# Patient Record
Sex: Male | Born: 1970 | Race: White | Hispanic: No | Marital: Married | Smoking: Former smoker
Health system: Southern US, Community
[De-identification: ages and names within clinical notes are randomized; demographics above are authoritative.]

## PROBLEM LIST (undated history)

## (undated) DIAGNOSIS — R079 Chest pain, unspecified: Secondary | ICD-10-CM

## (undated) DIAGNOSIS — B029 Zoster without complications: Secondary | ICD-10-CM

## (undated) DIAGNOSIS — M722 Plantar fascial fibromatosis: Secondary | ICD-10-CM

## (undated) DIAGNOSIS — M545 Low back pain, unspecified: Secondary | ICD-10-CM

## (undated) DIAGNOSIS — R109 Unspecified abdominal pain: Secondary | ICD-10-CM

## (undated) DIAGNOSIS — F410 Panic disorder [episodic paroxysmal anxiety] without agoraphobia: Secondary | ICD-10-CM

## (undated) DIAGNOSIS — S96811A Strain of other specified muscles and tendons at ankle and foot level, right foot, initial encounter: Secondary | ICD-10-CM

## (undated) DIAGNOSIS — T7840XA Allergy, unspecified, initial encounter: Secondary | ICD-10-CM

## (undated) DIAGNOSIS — K529 Noninfective gastroenteritis and colitis, unspecified: Secondary | ICD-10-CM

## (undated) DIAGNOSIS — R002 Palpitations: Secondary | ICD-10-CM

## (undated) DIAGNOSIS — F4310 Post-traumatic stress disorder, unspecified: Secondary | ICD-10-CM

## (undated) DIAGNOSIS — S069X9A Unspecified intracranial injury with loss of consciousness of unspecified duration, initial encounter: Secondary | ICD-10-CM

## (undated) HISTORY — DX: Low back pain, unspecified: M54.50

## (undated) HISTORY — DX: Zoster without complications: B02.9

## (undated) HISTORY — DX: Plantar fascial fibromatosis: M72.2

## (undated) HISTORY — DX: Noninfective gastroenteritis and colitis, unspecified: K52.9

## (undated) HISTORY — DX: Chest pain, unspecified: R07.9

## (undated) HISTORY — DX: Unspecified abdominal pain: R10.9

## (undated) HISTORY — DX: Allergy, unspecified, initial encounter: T78.40XA

## (undated) HISTORY — DX: Low back pain: M54.5

## (undated) HISTORY — PX: SHOULDER SURGERY: SHX246

## (undated) HISTORY — DX: Palpitations: R00.2

## (undated) HISTORY — DX: Strain of other specified muscles and tendons at ankle and foot level, right foot, initial encounter: S96.811A

## (undated) HISTORY — PX: ANKLE SURGERY: SHX546

---
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
## 2002-06-29 ENCOUNTER — Encounter: Payer: Self-pay | Admitting: Emergency Medicine

## 2002-06-29 ENCOUNTER — Emergency Department (HOSPITAL_COMMUNITY): Admission: EM | Admit: 2002-06-29 | Discharge: 2002-06-30 | Payer: Self-pay | Admitting: Emergency Medicine

## 2004-11-19 DIAGNOSIS — S069X9A Unspecified intracranial injury with loss of consciousness of unspecified duration, initial encounter: Secondary | ICD-10-CM

## 2004-11-19 DIAGNOSIS — S069XAA Unspecified intracranial injury with loss of consciousness status unknown, initial encounter: Secondary | ICD-10-CM

## 2004-11-19 HISTORY — DX: Unspecified intracranial injury with loss of consciousness status unknown, initial encounter: S06.9XAA

## 2004-11-19 HISTORY — DX: Unspecified intracranial injury with loss of consciousness of unspecified duration, initial encounter: S06.9X9A

## 2008-05-01 ENCOUNTER — Emergency Department (HOSPITAL_COMMUNITY): Admission: EM | Admit: 2008-05-01 | Discharge: 2008-05-01 | Payer: Self-pay | Admitting: Emergency Medicine

## 2008-07-13 ENCOUNTER — Emergency Department (HOSPITAL_COMMUNITY): Admission: EM | Admit: 2008-07-13 | Discharge: 2008-07-13 | Payer: Self-pay | Admitting: Emergency Medicine

## 2008-11-30 ENCOUNTER — Encounter: Admission: RE | Admit: 2008-11-30 | Discharge: 2008-11-30 | Payer: Self-pay | Admitting: Neurology

## 2008-12-01 ENCOUNTER — Encounter: Admission: RE | Admit: 2008-12-01 | Discharge: 2009-01-18 | Payer: Self-pay | Admitting: Neurology

## 2008-12-20 ENCOUNTER — Encounter: Admission: RE | Admit: 2008-12-20 | Discharge: 2008-12-20 | Payer: Self-pay | Admitting: Neurology

## 2009-01-11 ENCOUNTER — Emergency Department (HOSPITAL_COMMUNITY): Admission: EM | Admit: 2009-01-11 | Discharge: 2009-01-11 | Payer: Self-pay | Admitting: Emergency Medicine

## 2009-04-28 IMAGING — CR DG CHEST 2V
2 series · 2 of 2 positions shown · non-contrast
Comparison: Chest radiograph 05/01/2008

CLINICAL DATA: Tachycardia

CHEST - 2 VIEW

[w chest pa]
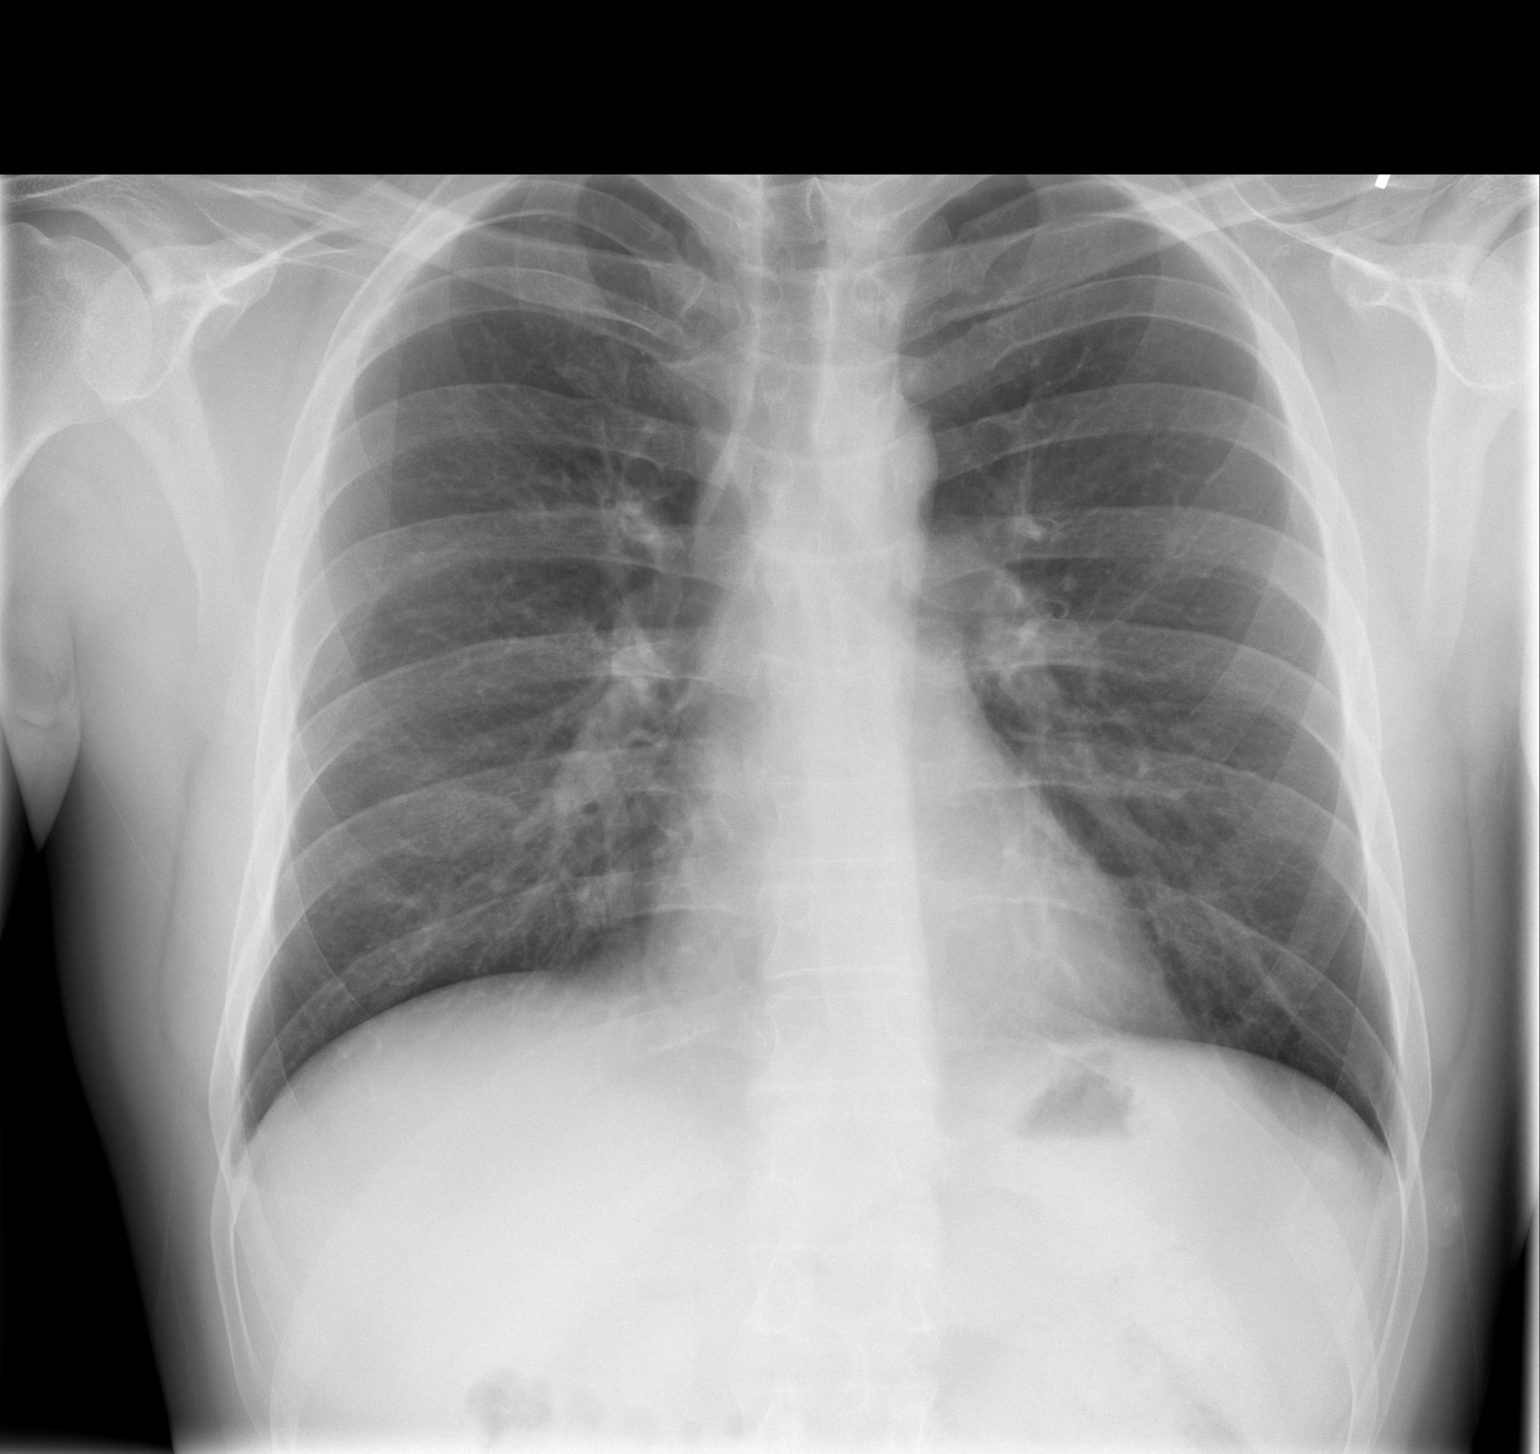

[w chest lat]
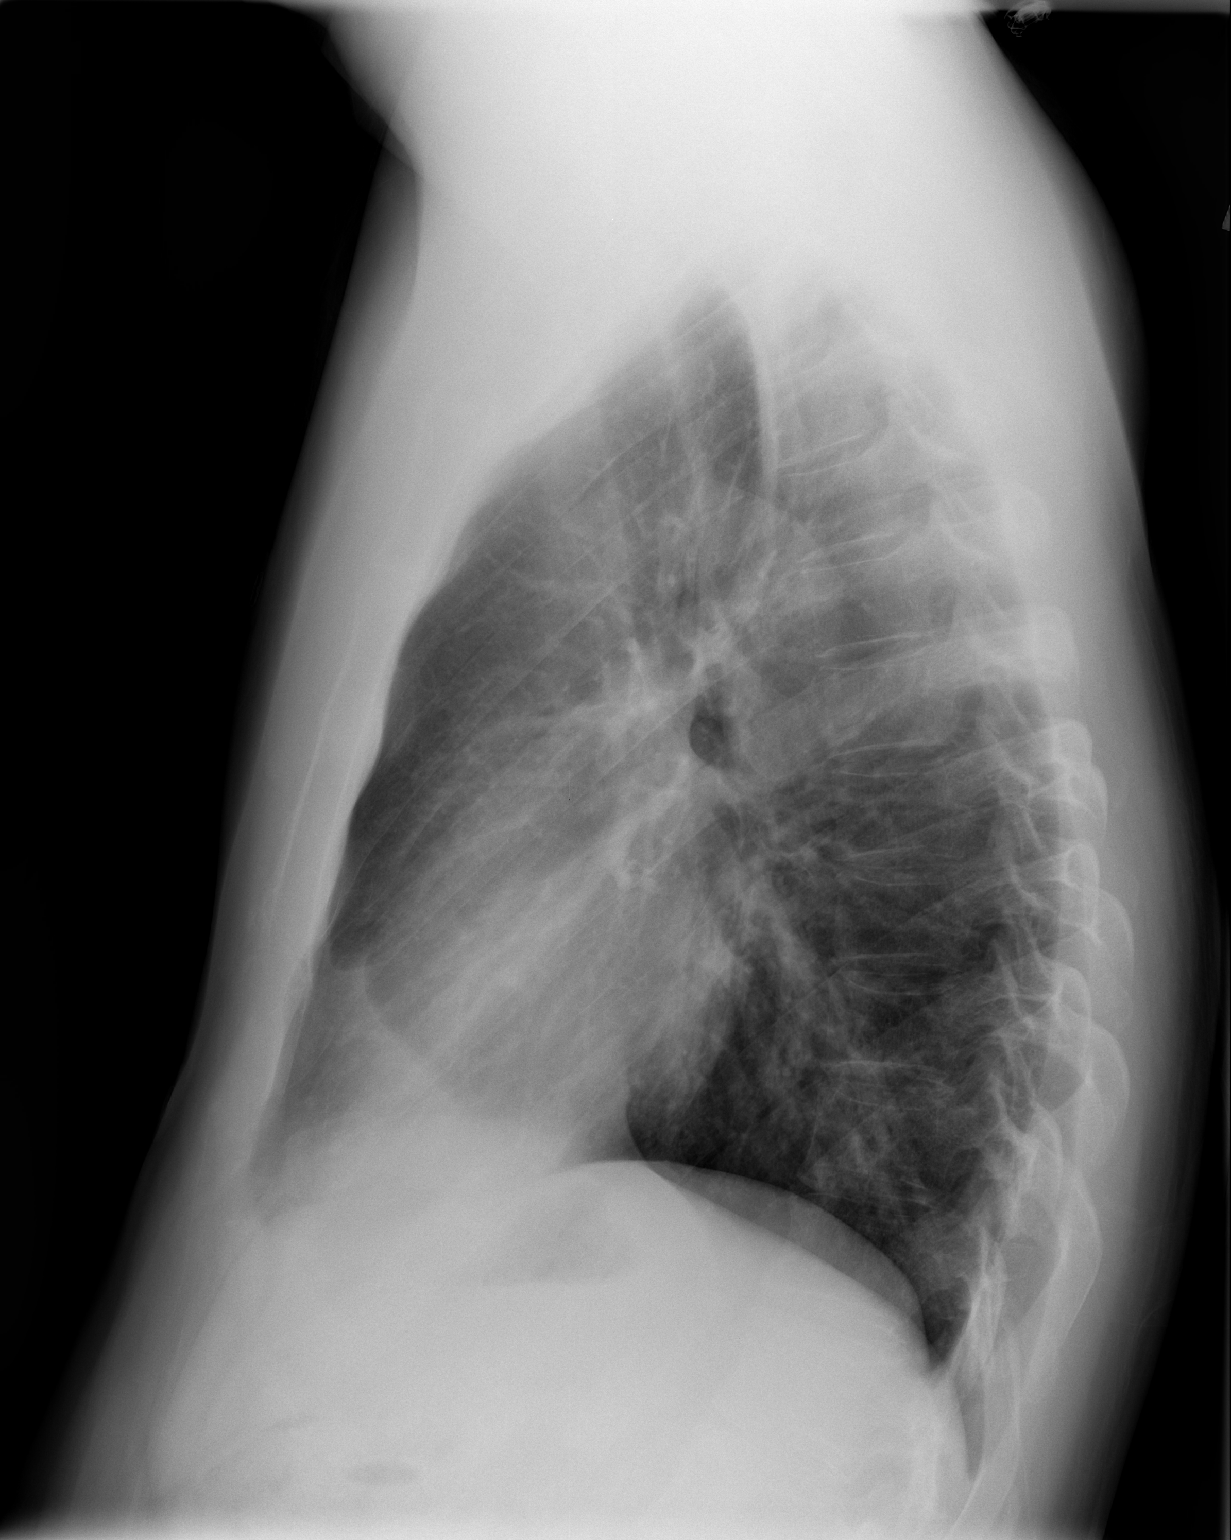

[2 of 2 positions shown; findings below may reference images not displayed]

FINDINGS: Normal mediastinum and heart silhouette.  Costophrenic
angles are clear.  No evidence effusion, infiltrate, or
pneumothorax.
IMPRESSION: No acute cardiopulmonary process.

## 2011-03-06 LAB — BASIC METABOLIC PANEL
BUN: 14 mg/dL (ref 6–23)
CO2: 29 mEq/L (ref 19–32)
Calcium: 9.7 mg/dL (ref 8.4–10.5)
Chloride: 104 mEq/L (ref 96–112)
Creatinine, Ser: 1.28 mg/dL (ref 0.4–1.5)
GFR calc Af Amer: >60 mL/min (ref >60)
GFR calc non Af Amer: >60 mL/min (ref >60)
Glucose, Bld: 91 mg/dL (ref 70–99)
Potassium: 3.7 mEq/L (ref 3.5–5.1)
Sodium: 141 mEq/L (ref 135–145)

## 2011-03-06 LAB — CBC
MCHC: 34.8 g/dL (ref 30.0–36.0)
MCV: 88.9 fL (ref 78.0–100.0)
RBC: 4.84 MIL/uL (ref 4.22–5.81)

## 2011-03-06 LAB — POCT CARDIAC MARKERS

## 2011-08-16 LAB — DIFFERENTIAL
Eosinophils Absolute: 0.1
Eosinophils Relative: 1
Lymphs Abs: 1.8
Monocytes Absolute: 0.5
Monocytes Relative: 8

## 2011-08-16 LAB — POCT I-STAT, CHEM 8
BUN: 16
Chloride: 106
Creatinine, Ser: 1.4
Potassium: 4.2
Sodium: 140

## 2011-08-16 LAB — CBC
HCT: 39.5
Hemoglobin: 13.5
MCV: 87.5
Platelets: 191
RBC: 4.51
WBC: 5.9

## 2013-08-14 ENCOUNTER — Emergency Department (HOSPITAL_COMMUNITY)
Admission: EM | Admit: 2013-08-14 | Discharge: 2013-08-14 | Disposition: A | Discharge status: Home / Self Care | Payer: 59 | Attending: Emergency Medicine | Admitting: Emergency Medicine

## 2013-08-14 ENCOUNTER — Encounter (HOSPITAL_COMMUNITY): Payer: Self-pay | Admitting: *Deleted

## 2013-08-14 ENCOUNTER — Emergency Department (HOSPITAL_COMMUNITY): Payer: 59

## 2013-08-14 DIAGNOSIS — S93409A Sprain of unspecified ligament of unspecified ankle, initial encounter: Secondary | ICD-10-CM | POA: Insufficient documentation

## 2013-08-14 DIAGNOSIS — S93401A Sprain of unspecified ligament of right ankle, initial encounter: Secondary | ICD-10-CM

## 2013-08-14 DIAGNOSIS — Y9302 Activity, running: Secondary | ICD-10-CM | POA: Insufficient documentation

## 2013-08-14 DIAGNOSIS — Y929 Unspecified place or not applicable: Secondary | ICD-10-CM | POA: Insufficient documentation

## 2013-08-14 DIAGNOSIS — X500XXA Overexertion from strenuous movement or load, initial encounter: Secondary | ICD-10-CM | POA: Insufficient documentation

## 2013-08-14 MED ORDER — IBUPROFEN 800 MG PO TABS
800.0000 mg | ORAL_TABLET | Freq: Once | ORAL | Status: AC
  Administered 2013-08-14: 800 mg via ORAL
  Filled 2013-08-14: qty 1

## 2013-08-14 MED ORDER — IBUPROFEN 800 MG PO TABS: 800.0000 mg | ORAL_TABLET | Freq: Four times a day (QID) | ORAL | Status: DC | PRN

## 2013-08-14 NOTE — ED Provider Notes (Signed)
Medical screening examination/treatment/procedure(s) were performed by non-physician practitioner and as supervising physician I was immediately available for consultation/collaboration.  Ama Mcmaster T Emarion Toral, MD 08/14/13 2316 

## 2013-08-14 NOTE — ED Notes (Signed)
Pt escorted to discharge window. Pt verbalized understanding discharge instructions. In no acute distress.  

## 2013-08-14 NOTE — ED Notes (Signed)
Pt reports he was running and felt a pop in his right ankle. Has pain from ankle up leg. Pain 9/10. Pt able to ambulate with difficulty.

## 2013-08-14 NOTE — ED Provider Notes (Signed)
CSN: [id number]     Arrival date & time 08/14/13  1511 History  This chart was scribed for non-physician practitioner Francee Piccolo, working with Toy Baker, MD by Carl Best, ED Scribe. This patient was seen in room WTR6/WTR6 and the patient's care was started at 4:21 PM.      Chief Complaint  Patient presents with  . Ankle Pain  . Leg Pain    Patient is a 42 y.o. male presenting with ankle pain and leg pain. The history is provided by the patient. No language interpreter was used.  Ankle Pain Associated symptoms: no fever   Leg Pain Associated symptoms: no fever    HPI Comments: Vernon Charles is a 42 y.o. male who presents to the Emergency Department complaining of right ankle pain that started today while he was running.  Patient states that he heard something "pop" while he was running and felt a pain shoot up his right leg.  He states that he tried to stretch out the affected area and put ice on it with no relief.  Patient states that walking aggravates the pain.  Patient denies fever and chills.  Patient denies a history of falls.  Patient denies taking Motrin for the pain.    History reviewed. No pertinent past medical history. Past Surgical History  Procedure Laterality Date  . Shoulder surgery      right shoulder- 2 surgery 1985 and 2011  . Ankle surgery      left 1989   History reviewed. No pertinent family history. History  Substance Use Topics  . Smoking status: Never Smoker   . Smokeless tobacco: Not on file  . Alcohol Use: Yes     Comment: socially    Review of Systems  Constitutional: Negative for fever and chills.  Musculoskeletal: Positive for arthralgias (right ankle).    Allergies  Review of patient's allergies indicates no known allergies.  Home Medications   Current Outpatient Rx  Name  Route  Sig  Dispense  Refill  . ibuprofen (ADVIL,MOTRIN) 800 MG tablet   Oral   Take 1 tablet (800 mg total) by mouth every 6 (six) hours as  needed for pain.   30 tablet   0     Triage Vitals: BP 130/93  Pulse 91  Temp(Src) 98.7 F (37.1 C) (Oral)  Resp 16  SpO2 97%  Physical Exam  Constitutional: He is oriented to person, place, and time. He appears well-developed and well-nourished. No distress.  HENT:  Head: Normocephalic and atraumatic.  Right Ear: External ear normal.  Left Ear: External ear normal.  Nose: Nose normal.  Eyes: Conjunctivae are normal.  Neck: Neck supple.  Pulmonary/Chest: Effort normal.  Musculoskeletal:       Right ankle: He exhibits normal range of motion, no swelling, no ecchymosis, no deformity, no laceration and normal pulse. Tenderness. Achilles tendon normal.       Left ankle: Normal.       Right lower leg: He exhibits tenderness. He exhibits no bony tenderness, no swelling, no edema, no deformity and no laceration.       Legs:      Right foot: Normal.       Left foot: Normal.       Feet:  Neurological: He is alert and oriented to person, place, and time.  Skin: Skin is warm and dry. He is not diaphoretic.  Psychiatric: He has a normal mood and affect.    ED Course  Procedures (including  critical care time)  DIAGNOSTIC STUDIES: Oxygen Saturation is 97% on room air, normal by my interpretation.    COORDINATION OF CARE: 4:23 PM- Discussed clinical suspicion of a strain to a muscle ligament and discharging the patient with an ace wrap and a prescription for Motrin.  Patient agreed to the treatment plan.     Labs Review Labs Reviewed - No data to display Imaging Review Dg Ankle Complete Right  08/14/2013   CLINICAL DATA:  Ankle pain while running.  EXAM: RIGHT ANKLE - COMPLETE 3+ VIEW  COMPARISON:  None.  FINDINGS: There is no evidence of fracture, dislocation, or joint effusion. There is no evidence of arthropathy or other focal bone abnormality. Soft tissues are unremarkable.  IMPRESSION: Negative.   Electronically Signed   By: Kennith Center M.D.   On: 08/14/2013 16:14    MDM    1. Right ankle sprain, initial encounter    Afebrile, NAD, non-toxic appearing, AAOx4. Neurovascularly intact. No sensory deficit. Imaging shows no fracture. Directed pt to ice injury, take acetaminophen or ibuprofen for pain, and to elevate and rest the injury when possible. Wrapped ankle for support and comfort. Pt declined crutches. Advised PCP followup. Resource card given. Advised patient to followup with orthopedist if symptoms not improving after one week of supportive measures. Return precautions discussed Patient agreeable to plan. Patient stable at time of discharge.   I personally performed the services described in this documentation, which was scribed in my presence. The recorded information has been reviewed and is accurate.     Jeannetta Ellis, PA-C 08/14/13 2011

## 2013-11-19 DIAGNOSIS — S96811A Strain of other specified muscles and tendons at ankle and foot level, right foot, initial encounter: Secondary | ICD-10-CM

## 2013-11-19 DIAGNOSIS — K529 Noninfective gastroenteritis and colitis, unspecified: Secondary | ICD-10-CM

## 2013-11-19 HISTORY — DX: Strain of other specified muscles and tendons at ankle and foot level, right foot, initial encounter: S96.811A

## 2013-11-19 HISTORY — DX: Noninfective gastroenteritis and colitis, unspecified: K52.9

## 2014-04-14 ENCOUNTER — Other Ambulatory Visit: Payer: Self-pay | Admitting: Internal Medicine

## 2014-04-14 DIAGNOSIS — R197 Diarrhea, unspecified: Secondary | ICD-10-CM

## 2014-04-14 DIAGNOSIS — R12 Heartburn: Secondary | ICD-10-CM

## 2014-04-22 ENCOUNTER — Ambulatory Visit
Admission: RE | Admit: 2014-04-22 | Discharge: 2014-04-22 | Disposition: A | Discharge status: Home / Self Care | Payer: 59 | Source: Ambulatory Visit | Attending: Internal Medicine | Admitting: Internal Medicine

## 2014-04-22 DIAGNOSIS — R197 Diarrhea, unspecified: Secondary | ICD-10-CM

## 2014-04-22 DIAGNOSIS — R12 Heartburn: Secondary | ICD-10-CM

## 2015-01-18 DIAGNOSIS — R109 Unspecified abdominal pain: Secondary | ICD-10-CM

## 2015-01-18 HISTORY — DX: Unspecified abdominal pain: R10.9

## 2015-04-06 ENCOUNTER — Encounter: Payer: Self-pay | Admitting: Sports Medicine

## 2015-04-06 ENCOUNTER — Ambulatory Visit: Payer: Self-pay | Admitting: Podiatry

## 2015-04-06 ENCOUNTER — Ambulatory Visit (INDEPENDENT_AMBULATORY_CARE_PROVIDER_SITE_OTHER): Payer: 59 | Admitting: Sports Medicine

## 2015-04-06 VITALS — BP 131/78 | HR 79 | Ht 72.0 in | Wt 190.0 lb

## 2015-04-06 DIAGNOSIS — M722 Plantar fascial fibromatosis: Secondary | ICD-10-CM

## 2015-04-14 ENCOUNTER — Encounter: Payer: Self-pay | Admitting: Sports Medicine

## 2015-04-14 DIAGNOSIS — M722 Plantar fascial fibromatosis: Secondary | ICD-10-CM | POA: Insufficient documentation

## 2015-04-14 NOTE — Progress Notes (Signed)
  Lovette ClicheCarter R Burrowes - 44 y.o. male MRN [id number]  Date of birth: 1971-09-25  SUBJECTIVE: CC: 1.  right heel pain, initial evaluation      HPI:   long-standing symptoms with 2 weeks of significant worsening of right plantar heel pain. Pain is worse upon first weightbearing after sleep. Also worse with prolonged sitting immobilization such as when he is in his [employer]'s car.  Has tried gentle stretching, anti-inflammatory's no significant improvement.  Currently not interfering with his ability to perform his job however is having significant pain.  Seen by his PCP referred him here for further evaluation gait.     ROS:   denies fevers, chills bilateral symptoms.    HISTORY:  Past Medical, Surgical, Social, and Family History reviewed & updated per EMR.  Pertinent Historical Findings include: Social History   Occupational History  . [employer]   Social History Main Topics  . Smoking status: Never Smoker   . Smokeless tobacco: Not on file  . Alcohol Use: Yes     Comment: socially  . Drug Use: No  . Sexual Activity: Not on file    No specialty comments available. Problem  Plantar Fasciitis of Right Foot   Otherwise relatively healthy  OBJECTIVE:  VS:   HT:6' (182.9 cm)   WT:190 lb (86.183 kg)  BMI:25.8          BP:131/78 mmHg  HR:79bpm  TEMP: ( )  RESP:   PHYSICAL EXAM: GENERAL:  adult, well-built Caucasian male. No acute distress  PSYCH: Alert and appropriately interactive.  SKIN: No open skin lesions or abnormal skin markings on areas inspected as below  VASCULAR: DP & PT pulses 2+/4. No pre-tibial edema  NEURO: Lower extremity strength is 5+/5 in all myotomes; sensation is intact to light touch in all dermatomes.  RIGHT FOOT: Overall well aligned no significant deformity. No significant hindfoot varus or valgus. Midfoot longitudinal arch is well maintained. Forefoot transverse arch is moderate with slight splay toe. Overall good posterior  tibialis recruitment. No significant Morton's callus. Significant tenderness palpation over the insertion of the plantar fascia. Ankle dorsiflexion to 90 on the right, 100 on the left.  DATA OBTAINED: No notes on file LIMITED MSK ULTRASOUND OF RIGHT FOOT: Findings:  Markedly thickened and hypoechoic insertion of the plantar fascia of the right compared to the left. Left is measuring normal approximately 0.4 cm; left is thickened approximately 0.65 cm Impression: Acute right plantar fasciitis  ASSESSMENT & PLAN: See problem based charting & AVS for additional documentation Problem List Items Addressed This Visit    Plantar fasciitis of right foot - Primary    Body Helix right ankle compression sleeve provided today. Discussed using during activity as well as for the first 2 hours following activity and PRN. Alfredson exercises with emphasis on eccentric phase Green sports insoles, discussed use of gel heel cups if needed but suspect will improve with body helix and Green sports insoles. Ice when necessary, NSAIDs for pain but discussed this is only temporizing.         FOLLOW UP:  Return in about 6 weeks (around 05/18/2015), or if symptoms worsen or fail to improve, for Consideration of repeat ultrasound to see how he is doing..Marland Kitchen

## 2015-04-14 NOTE — Assessment & Plan Note (Addendum)
Body Helix right ankle compression sleeve provided today. Discussed using during activity as well as for the first 2 hours following activity and PRN. Alfredson exercises with emphasis on eccentric phase Green sports insoles, discussed use of gel heel cups if needed but suspect will improve with body helix and Green sports insoles. Ice when necessary, NSAIDs for pain but discussed this is only temporizing.

## 2015-08-03 ENCOUNTER — Encounter: Payer: Self-pay | Admitting: Sports Medicine

## 2015-08-03 ENCOUNTER — Ambulatory Visit (INDEPENDENT_AMBULATORY_CARE_PROVIDER_SITE_OTHER): Payer: 59 | Admitting: Sports Medicine

## 2015-08-03 VITALS — BP 144/95 | Ht 72.0 in | Wt 194.0 lb

## 2015-08-03 DIAGNOSIS — M722 Plantar fascial fibromatosis: Secondary | ICD-10-CM

## 2015-08-03 MED ORDER — MELOXICAM 15 MG PO TABS: ORAL_TABLET | ORAL | Status: DC

## 2015-08-03 NOTE — Progress Notes (Signed)
   Subjective:    Patient ID: Vernon Charles, male    DOB: 11/03/1971, 44 y.o.   MRN: [id number]  HPI chief complaint: Bilateral heel pain  Patient comes in today complaining of returning bilateral heel pain. He has a history of plantar fasciitis. He was treated by Dr. Berline Chough back in May with some green sports insoles, Alfredson heel drop exercises, and prn NSAIDs. As a result his symptoms resolved. He began to return to running last week and as a result his pain returned. Pain is primarily in the heel but will radiate into the arch. Worse with first step in the morning. Worse at the end of the day. When he returned to running he was running every a. No numbness or tingling. He does admit to stopping his exercises once his pain resolved. He is also getting some mild discomfort in the posterior right knee. It too has been present for about 2 weeks. No swelling. No locking or catching.    Review of Systems     Objective:   Physical Exam Well-developed, well-nourished. No acute distress  Examination of both heels show tenderness to palpation at the calcaneal insertion of the plantar fascia. Negative alcaneal squeeze. Fairly neutral arch. Neurovascularly intact distally. Walking without a limp.  Right knee: Full range of motion. No effusion. No joint line tenderness. Good joint stability. There is some tenderness to palpation in the popliteal fossa but no palpable cyst. Neurovascularly intact distally.  MSK ultrasound of his right heel shows thickening and edema of the origin of the plantar fascia. Findings consistent with plantar fasciitis.       Assessment & Plan:  Returning bilateral heel pain secondary to plantar fasciitis Right knee pain secondary to muscle strain  Meloxicam 15 mg daily for 7 days then when necessary. Patient has a fairly good insert in his shoes but we did discuss the possibility of custom orthotics if symptoms persist. Would like for him to resume his Alfredson  heel drop exercises and he will start some daily icing as well. I did reassure him that he can try to run with this condition as long as his pain is not worsening and he is not limping. I did recommend that he start off running at least every other day and not 2 days in a row and to run on a soft surface. If symptoms persist for another 2-3 weeks he will return for consideration of custom orthotics. I think his posterior right knee pain is likely compensatory and hopefully the meloxicam will help with this as well. At this point in time I am not suspicious of intra-articular pathology.

## 2015-08-13 ENCOUNTER — Emergency Department (HOSPITAL_COMMUNITY): Payer: 59

## 2015-08-13 ENCOUNTER — Encounter (HOSPITAL_COMMUNITY): Payer: Self-pay | Admitting: Emergency Medicine

## 2015-08-13 ENCOUNTER — Emergency Department (HOSPITAL_COMMUNITY)
Admission: EM | Admit: 2015-08-13 | Discharge: 2015-08-13 | Disposition: A | Discharge status: Home / Self Care | Payer: 59 | Attending: Emergency Medicine | Admitting: Emergency Medicine

## 2015-08-13 DIAGNOSIS — R110 Nausea: Secondary | ICD-10-CM | POA: Diagnosis not present

## 2015-08-13 DIAGNOSIS — Z79899 Other long term (current) drug therapy: Secondary | ICD-10-CM | POA: Insufficient documentation

## 2015-08-13 DIAGNOSIS — R0789 Other chest pain: Secondary | ICD-10-CM | POA: Diagnosis not present

## 2015-08-13 DIAGNOSIS — R079 Chest pain, unspecified: Secondary | ICD-10-CM | POA: Diagnosis present

## 2015-08-13 DIAGNOSIS — R42 Dizziness and giddiness: Secondary | ICD-10-CM | POA: Insufficient documentation

## 2015-08-13 LAB — CBC WITH DIFFERENTIAL/PLATELET
BASOS ABS: 0 10*3/uL (ref 0.0–0.1)
BASOS PCT: 0 %
Eosinophils Absolute: 0.1 10*3/uL (ref 0.0–0.7)
Eosinophils Relative: 1 %
HEMATOCRIT: 41.1 % (ref 39.0–52.0)
HEMOGLOBIN: 14.1 g/dL (ref 13.0–17.0)
LYMPHS PCT: 29 %
Lymphs Abs: 2.2 10*3/uL (ref 0.7–4.0)
MCH: 30.4 pg (ref 26.0–34.0)
MCHC: 34.3 g/dL (ref 30.0–36.0)
MCV: 88.6 fL (ref 78.0–100.0)
MONO ABS: 0.8 10*3/uL (ref 0.1–1.0)
MONOS PCT: 10 %
NEUTROS ABS: 4.5 10*3/uL (ref 1.7–7.7)
NEUTROS PCT: 60 %
Platelets: 194 10*3/uL (ref 150–400)
RBC: 4.64 MIL/uL (ref 4.22–5.81)
RDW: 12.2 % (ref 11.5–15.5)
WBC: 7.6 10*3/uL (ref 4.0–10.5)

## 2015-08-13 LAB — COMPREHENSIVE METABOLIC PANEL
ALBUMIN: 4.1 g/dL (ref 3.5–5.0)
ALT: 17 U/L (ref 17–63)
ANION GAP: 8 (ref 5–15)
AST: 17 U/L (ref 15–41)
Alkaline Phosphatase: 63 U/L (ref 38–126)
BUN: 18 mg/dL (ref 6–20)
CO2: 28 mmol/L (ref 22–32)
Calcium: 9.5 mg/dL (ref 8.9–10.3)
Chloride: 104 mmol/L (ref 101–111)
Creatinine, Ser: 1.25 mg/dL — ABNORMAL HIGH (ref 0.61–1.24)
GFR calc non Af Amer: >60 mL/min (ref >60)
GLUCOSE: 90 mg/dL (ref 65–99)
POTASSIUM: 3.5 mmol/L (ref 3.5–5.1)
SODIUM: 140 mmol/L (ref 135–145)
TOTAL PROTEIN: 6.5 g/dL (ref 6.5–8.1)
Total Bilirubin: 0.5 mg/dL (ref 0.3–1.2)

## 2015-08-13 LAB — D-DIMER, QUANTITATIVE: D-Dimer, Quant: 0.3 ug/mL-FEU (ref 0.00–0.48)

## 2015-08-13 LAB — TROPONIN I

## 2015-08-13 LAB — I-STAT TROPONIN, ED: TROPONIN I, POC: 0 ng/mL (ref 0.00–0.08)

## 2015-08-13 MED ORDER — SODIUM CHLORIDE 0.9 % IV BOLUS (SEPSIS)
500.0000 mL | Freq: Once | INTRAVENOUS | Status: AC
  Administered 2015-08-13: 500 mL via INTRAVENOUS

## 2015-08-13 NOTE — Discharge Instructions (Signed)
As discussed, your evaluation today has been largely reassuring.  But, it is important that you monitor your condition carefully, and do not hesitate to return to the ED if you develop new, or concerning changes in your condition.  It is very important to follow-up with our cardiology colleagues to complete the evaluation of today's episode of chest pain.   Chest Pain (Nonspecific) It is often hard to give a diagnosis for the cause of chest pain. There is always a chance that your pain could be related to something serious, such as a heart attack or a blood clot in the lungs. You need to follow up with your doctor. HOME CARE  If antibiotic medicine was given, take it as directed by your doctor. Finish the medicine even if you start to feel better.  For the next few days, avoid activities that bring on chest pain. Continue physical activities as told by your doctor.  Do not use any tobacco products. This includes cigarettes, chewing tobacco, and e-cigarettes.  Avoid drinking alcohol.  Only take medicine as told by your doctor.  Follow your doctor's suggestions for more testing if your chest pain does not go away.  Keep all doctor visits you made. GET HELP IF:  Your chest pain does not go away, even after treatment.  You have a rash with blisters on your chest.  You have a fever. GET HELP RIGHT AWAY IF:   You have more pain or pain that spreads to your arm, neck, jaw, back, or belly (abdomen).  You have shortness of breath.  You cough more than usual or cough up blood.  You have very bad back or belly pain.  You feel sick to your stomach (nauseous) or throw up (vomit).  You have very bad weakness.  You pass out (faint).  You have chills. This is an emergency. Do not wait to see if the problems will go away. Call your local emergency services (911 in [place] Do not drive yourself to the hospital. MAKE SURE YOU:   Understand these instructions.  Will watch your  condition.  Will get help right away if you are not doing well or get worse. Document Released: 04/23/2008 Document Revised: 11/10/2013 Document Reviewed: 04/23/2008 [facility] Patient Information 2015 [place], [place]. This information is not intended to replace advice given to you by your health care provider. Make sure you discuss any questions you have with your health care provider.

## 2015-08-13 NOTE — ED Provider Notes (Signed)
CSN: [id number]     Arrival date & time 08/13/15  0221 History  This chart was scribed for Gerhard Munch, MD by Octavia Heir, ED Scribe. This patient was seen in room A09C/A09C and the patient's care was started at 2:36 AM.    Chief Complaint  Patient presents with  . Chest Pain    The patient said he has not feeling well all day.  He said he started feeling dizzy so he went back to the [employer]".  He told the people he works with to bring him.        The history is provided by the patient.   HPI Comments: Vernon Charles is a 44 y.o. male who presents to the Emergency Department complaining of constant, gradual worsening chest pain onset 22 hours ago. He has been having associated intermittent dizziness, nausea. Pt reports having tightness on the left side of his chest this morning upon awakening and notes he started having some pain in his arms. He notes that he has not been feeling well all day and his chest tightness became progressively worse. He states he is an overall healthy person. Pt has paternal hx of MI and CVA. Pt denies headache, jaw pain, weakness, tingling, back pain, difficulty urinating, and difficulty seeing. Pt is a non-smoker.  No past medical history on file. Past Surgical History  Procedure Laterality Date  . Shoulder surgery      right shoulder- 2 surgery 1985 and 2011  . Ankle surgery      left 1989   No family history on file. Social History  Substance Use Topics  . Smoking status: Never Smoker   . Smokeless tobacco: Not on file  . Alcohol Use: Yes     Comment: socially    Review of Systems  Constitutional:       Per HPI, otherwise negative  HENT:       Per HPI, otherwise negative  Respiratory:       Per HPI, otherwise negative  Cardiovascular: Positive for chest pain.       Per HPI, otherwise negative  Gastrointestinal: Positive for nausea. Negative for vomiting.  Endocrine:       Negative aside from HPI  Genitourinary:       Neg aside  from HPI   Musculoskeletal:       Per HPI, otherwise negative  Skin: Negative.   Neurological: Positive for dizziness. Negative for syncope.      Allergies  Review of patient's allergies indicates no known allergies.  Home Medications   Prior to Admission medications   Medication Sig Start Date End Date Taking? Authorizing Provider  cholecalciferol (VITAMIN D) 1000 UNITS tablet Take 1,000 Units by mouth daily.    Historical Provider, MD  ibuprofen (ADVIL,MOTRIN) 800 MG tablet Take 1 tablet (800 mg total) by mouth every 6 (six) hours as needed for pain. 08/14/13   Francee Piccolo, PA-C  meloxicam (MOBIC) 15 MG tablet Take one a day with food for 7 days then prn 08/03/15   Ralene Cork, DO   Triage vitals: BP 163/97 mmHg  Pulse 76  Resp 18  SpO2 100% Physical Exam  Constitutional: He is oriented to person, place, and time. He appears well-developed. No distress.  HENT:  Head: Normocephalic and atraumatic.  Eyes: Conjunctivae and EOM are normal.  Cardiovascular: Normal rate and regular rhythm.   Pulmonary/Chest: Effort normal. No stridor. No respiratory distress.  Abdominal: He exhibits no distension.  Musculoskeletal: He exhibits no edema.  Neurological: He is alert and oriented to person, place, and time.  Equal bilateral strength in upper extremities  Skin: Skin is warm and dry.  Psychiatric: He has a normal mood and affect.  Nursing note and vitals reviewed.   ED Course  Procedures  DIAGNOSTIC STUDIES: Oxygen Saturation is 100% on RA, normal by my interpretation.  COORDINATION OF CARE:  2:42 AM Discussed treatment plan with pt at bedside and pt agreed to plan.  Labs Review Labs Reviewed  COMPREHENSIVE METABOLIC PANEL - Abnormal; Notable for the following:    Creatinine, Ser 1.25 (*)    All other components within normal limits  CBC WITH DIFFERENTIAL/PLATELET  D-DIMER, QUANTITATIVE (NOT AT [facility])  TROPONIN I  I-STAT TROPOININ, ED    Imaging Review  I  have personally reviewed and evaluated these images and lab results as part of my medical decision-making.   EKG Interpretation  Date/Time:  Saturday August 13 2015 02:26:03 EDT Ventricular Rate:  77 PR Interval:  186 QRS Duration: 98 QT Interval:  382 QTC Calculation: 432 R Axis:   53 Text Interpretation:  Normal sinus rhythm Normal ECG Sinus rhythm Artifact Non-specific intra-ventricular conduction delay No significant change since last tracing Abnormal ekg Confirmed by Gerhard Munch  MD ([id number]) on 08/13/2015 2:46:37 AM      On re-eval following IVF, the patient stated that he felt substantially better.  We discussed all findings at length.  Specifically we discussed the need to complete his eval for chest discomfort with our cardiology colleagues in the coming days.  With Sx resolution, and reassuring labs, he was d/c in stable condition.  MDM  This well M p/w one day of chest discomfort, and general illness.  No e/o ACS, PE, PNA, PTX, or other acuter new pathology.  There is mild Cr abnormality which is likely contributory.  With reassuring findings, and minimal risk profile for occult ACS, he will continue his eval as an outpatient.  I personally performed the services described in this documentation, which was scribed in my presence. The recorded information has been reviewed and is accurate.    Gerhard Munch, MD 08/15/15 1214

## 2015-08-13 NOTE — ED Notes (Signed)
The patient said he has not feeling well all day.  He said he started feeling dizzy so he went back to the [employer]".  He told the people he works with to bring him.  The patient rates his pain 2/10.  He denies any other symptoms other than the dizziness.

## 2016-01-12 ENCOUNTER — Ambulatory Visit (INDEPENDENT_AMBULATORY_CARE_PROVIDER_SITE_OTHER): Payer: 59

## 2016-01-12 ENCOUNTER — Encounter: Payer: Self-pay | Admitting: Podiatry

## 2016-01-12 ENCOUNTER — Ambulatory Visit (INDEPENDENT_AMBULATORY_CARE_PROVIDER_SITE_OTHER): Payer: 59 | Admitting: Podiatry

## 2016-01-12 VITALS — BP 130/93 | HR 76 | Resp 12

## 2016-01-12 DIAGNOSIS — M722 Plantar fascial fibromatosis: Secondary | ICD-10-CM

## 2016-01-12 MED ORDER — METHYLPREDNISOLONE 4 MG PO TBPK: ORAL_TABLET | ORAL | Status: DC

## 2016-01-12 MED ORDER — MELOXICAM 15 MG PO TABS: 15.0000 mg | ORAL_TABLET | Freq: Every day | ORAL | Status: DC

## 2016-01-12 NOTE — Progress Notes (Signed)
   Subjective:    Patient ID: Vernon Charles, male    DOB: 06-Apr-1971, 45 y.o.   MRN: [id number]  HPI: He presents today with greater than a year history of bilateral heel pain. He states that mornings are particularly bad and x-rays been sitting for a while. He states it hurts to run and be on his feet for long. His of time. 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 He states it is hard to maintain his physical athletics with painful heels. He has tried ice shoe gear change and nonsteroidals all to no avail.  Review of Systems  Musculoskeletal: Positive for gait problem.  All other systems reviewed and are negative.      Objective:   Physical Exam: Vital signs are stable he is alert and oriented 3. I have reviewed his past medical history medications allergies surgery social history and review of systems area pulses are strongly palpable. Neurologic sensorium is intact per Semmes-Weinstein monofilament. Deep tendon reflexes are intact bilateral. Normal plantar response bilateral. Muscle strength +5/5 dorsiflexors plantar flexors and inverters everters all intrinsic musculature is intact. Orthopedic evaluation demonstrates pain on palpation medial calcaneal tubercles bilateral. Mild pes planus bilateral. Gastroc equinus bilateral. Radiographs confirm plantar distally oriented calcaneal heel spur with a soft tissue increase in density at the plantar fascial calcaneal insertion site indicative of plantar fasciitis. Cutaneous evaluation demonstrates no open lesions oral wounds. No signs of infection.        Assessment & Plan:  Plantar fasciitis bilateral with pes planus. Gastroc equinus bilateral.  Plan: Discussed etiology pathology conservative versus surgical therapies. He was scanned for set of orthotics today placed on a Medrol Dosepak to be followed by meloxicam. Injected the bilateral heels today with Kenalog and local anesthetic presented him with plantar  fascial braces and the night splint. We discussed appropriate shoe gear stretching exercises ice therapy and shoe gear modifications and he was provided with instructions for stretching exercises. I will follow-up with him in 1 month.

## 2016-01-12 NOTE — Patient Instructions (Signed)
Plantar Fasciitis Plantar fasciitis is a painful foot condition that affects the heel. It occurs when the band of tissue that connects the toes to the heel bone (plantar fascia) becomes irritated. This can happen after exercising too much or doing other repetitive activities (overuse injury). The pain from plantar fasciitis can range from mild irritation to severe pain that makes it difficult for you to walk or move. The pain is usually worse in the morning or after you have been sitting or lying down for a while. CAUSES This condition may be caused by:  Standing for long periods of time.  Wearing shoes that do not fit.  Doing high-impact activities, including running, aerobics, and ballet.  Being overweight.  Having an abnormal way of walking (gait).  Having tight calf muscles.  Having high arches in your feet.  Starting a new athletic activity. SYMPTOMS The main symptom of this condition is heel pain. Other symptoms include:  Pain that gets worse after activity or exercise.  Pain that is worse in the morning or after resting.  Pain that goes away after you walk for a few minutes. DIAGNOSIS This condition may be diagnosed based on your signs and symptoms. Your health care provider will also do a physical exam to check for:  A tender area on the bottom of your foot.  A high arch in your foot.  Pain when you move your foot.  Difficulty moving your foot. You may also need to have imaging studies to confirm the diagnosis. These can include:  X-rays.  Ultrasound.  MRI. TREATMENT  Treatment for plantar fasciitis depends on the severity of the condition. Your treatment may include:  Rest, ice, and over-the-counter pain medicines to manage your pain.  Exercises to stretch your calves and your plantar fascia.  A splint that holds your foot in a stretched, upward position while you sleep (night splint).  Physical therapy to relieve symptoms and prevent problems in the  future.  Cortisone injections to relieve severe pain.  Extracorporeal shock wave therapy (ESWT) to stimulate damaged plantar fascia with electrical impulses. It is often used as a last resort before surgery.  Surgery, if other treatments have not worked after 12 months. HOME CARE INSTRUCTIONS  Take medicines only as directed by your health care provider.  Avoid activities that cause pain.  Roll the bottom of your foot over a bag of ice or a bottle of cold water. Do this for 20 minutes, 3-4 times a day.  Perform simple stretches as directed by your health care provider.  Try wearing athletic shoes with air-sole or gel-sole cushions or soft shoe inserts.  Wear a night splint while sleeping, if directed by your health care provider.  Keep all follow-up appointments with your health care provider. PREVENTION   Do not perform exercises or activities that cause heel pain.  Consider finding low-impact activities if you continue to have problems.  Lose weight if you need to. The best way to prevent plantar fasciitis is to avoid the activities that aggravate your plantar fascia. SEEK MEDICAL CARE IF:  Your symptoms do not go away after treatment with home care measures.  Your pain gets worse.  Your pain affects your ability to move or do your daily activities.   This information is not intended to replace advice given to you by your health care provider. Make sure you discuss any questions you have with your health care provider.   Document Released: 07/31/2001 Document Revised: 07/27/2015 Document Reviewed: 09/15/2014 Elsevier   Interactive Patient Education 2016 [employer]vier Inc.  

## 2016-02-02 ENCOUNTER — Encounter: Payer: Self-pay | Admitting: Podiatry

## 2016-02-02 ENCOUNTER — Ambulatory Visit (INDEPENDENT_AMBULATORY_CARE_PROVIDER_SITE_OTHER): Payer: 59 | Admitting: Podiatry

## 2016-02-02 VITALS — BP 144/96 | HR 77 | Resp 12

## 2016-02-02 DIAGNOSIS — M722 Plantar fascial fibromatosis: Secondary | ICD-10-CM

## 2016-02-02 NOTE — Progress Notes (Signed)
He presents today for follow-up of his plantar fasciitis and he got his orthotics. He states that he is approximately 70% better at best on the right foot and about 50% better on the left foot. He continues his medications and his plantar fascial brace his night splint as well as his shoe gear.  Objective: Vital signs are stable he is alert and oriented 3. Pulses are palpable. Neurologic sensorium is intact. No open lesions or wounds. He has palpable pain to the plantar fascial calcaneal insertion site bilateral left greater than right.  Assessment: Slowly resolving plantar fasciitis bilateral.  Plan: Discussed etiology pathology conservative versus surgical therapies. Dispensed orthotics today reinjected his bilateral heels he will continue his medications. We will also write him a physicians noted stating that he is unable to participate in his fitness drills particularly running or any high impact activity regarding his feet. This shouldn't limit him for a period of 2 additional months. I will follow-up with him in 1 month.

## 2016-03-06 ENCOUNTER — Encounter: Payer: Self-pay | Admitting: Podiatry

## 2016-03-06 ENCOUNTER — Ambulatory Visit (INDEPENDENT_AMBULATORY_CARE_PROVIDER_SITE_OTHER): Payer: 59 | Admitting: Podiatry

## 2016-03-06 VITALS — BP 155/103 | HR 83 | Resp 16

## 2016-03-06 DIAGNOSIS — M722 Plantar fascial fibromatosis: Secondary | ICD-10-CM | POA: Diagnosis not present

## 2016-03-06 MED ORDER — DICLOFENAC SODIUM 75 MG PO TBEC: 75.0000 mg | DELAYED_RELEASE_TABLET | Freq: Two times a day (BID) | ORAL | Status: DC

## 2016-03-06 MED ORDER — METHYLPREDNISOLONE 4 MG PO TBPK: ORAL_TABLET | ORAL | Status: DC

## 2016-03-06 NOTE — Progress Notes (Signed)
He presents today for follow-up of his bilateral plantar fasciitis and his orthotics. He states that the orthotics are doing just great and his right foot is 100% improved. His left foot remains at 50% and has not improved since last visit. He continues to take his meloxicam regularly but doubts whether it seems to help or not.  Objective: Vital signs are stable alert and oriented 3. He has pain on palpation medial calcaneal tubercle of the left heel. Pulses remain palpable to the foot no open lesions or wounds are noted.  Assessment: Well-healing plantar fasciitis right foot. Chronic intractable plantar fasciitis left foot.  Plan: I reinjected the area today and recommended that he continue all conservative therapies. I also wrote another prescription for a Medrol Dosepak as well as I changed his meloxicam to diclofenac. I will follow-up with him in 1 month.

## 2016-04-05 ENCOUNTER — Ambulatory Visit (INDEPENDENT_AMBULATORY_CARE_PROVIDER_SITE_OTHER): Payer: 59 | Admitting: Podiatry

## 2016-04-05 ENCOUNTER — Encounter: Payer: Self-pay | Admitting: Podiatry

## 2016-04-05 DIAGNOSIS — M722 Plantar fascial fibromatosis: Secondary | ICD-10-CM

## 2016-04-05 NOTE — Progress Notes (Signed)
He presents today with a chief complaint of plantar fasciitis left greater than right. He states that his left foot is approximately 90% improved but his right foot is 100% improved. He continues all conservative therapies including orthotics and oral anti-inflammatories.  Objective: Vital signs are stable he is alert and oriented 3. Pulses are palpable. Neurologic sensorium is intact. He has no pain on palpation medial calcaneal tubercle bilateral.  Assessment: Well-healing plantar fasciitis bilateral.  Plan: Continue conservative therapies follow up with me in 1 month.

## 2016-04-25 ENCOUNTER — Other Ambulatory Visit: Payer: Self-pay | Admitting: Podiatry

## 2016-04-26 NOTE — Telephone Encounter (Signed)
Pt is to follow up with Dr. Al CorpusHyatt.

## 2016-05-03 ENCOUNTER — Encounter (INDEPENDENT_AMBULATORY_CARE_PROVIDER_SITE_OTHER): Payer: 59 | Admitting: Podiatry

## 2016-05-03 NOTE — Progress Notes (Signed)
This encounter was created in error - please disregard.

## 2016-06-12 ENCOUNTER — Telehealth: Payer: Self-pay | Admitting: *Deleted

## 2016-06-12 ENCOUNTER — Ambulatory Visit (INDEPENDENT_AMBULATORY_CARE_PROVIDER_SITE_OTHER): Payer: 59 | Admitting: Podiatry

## 2016-06-12 ENCOUNTER — Encounter: Payer: Self-pay | Admitting: Podiatry

## 2016-06-12 DIAGNOSIS — M722 Plantar fascial fibromatosis: Secondary | ICD-10-CM

## 2016-06-12 NOTE — Telephone Encounter (Signed)
-----   Message from Kristian Covey, [facility] sent at 06/12/2016  9:33 AM EDT ----- Regarding: MRI Orders are in! Thanks!

## 2016-06-12 NOTE — Telephone Encounter (Signed)
MRI ordered by Dr. Al Corpus. [place] [phone], PRIOR AUTHORIZED MRI LEFT FOOT 73718, AUTHORIZATION - [id number], VALID 45 DAYS EXPIRES 07/27/2016. FAXED TO [facility].

## 2016-06-13 NOTE — Progress Notes (Signed)
He follows up today for a complaint of plantar fasciitis. He states that his right foot is doing 100% better however his left foot is starting to regress from 90% down to approximately 50% of this point.  Objective: Vital signs are stable he is alert and oriented 3. Pulses are palpable. He has no pain on palpation medial calcaneal tubercle of the right heel however left feels exquisitely painful area  Assessment: Chronic intractable plantar fasciitis left heel.  Plan: At this point I encouraged him to continue to wear his orthotics I also encouraged an MRI of the left foot to ascertain as to whether or not this is a surgical intervention.

## 2016-06-27 ENCOUNTER — Ambulatory Visit
Admission: RE | Admit: 2016-06-27 | Discharge: 2016-06-27 | Disposition: A | Discharge status: Home / Self Care | Payer: 59 | Source: Ambulatory Visit | Attending: Podiatry | Admitting: Podiatry

## 2016-06-27 DIAGNOSIS — M722 Plantar fascial fibromatosis: Secondary | ICD-10-CM

## 2016-06-28 NOTE — Telephone Encounter (Addendum)
-----   Message from Elinor ParkinsonMax T Hyatt, [place] sent at 06/27/2016 12:23 PM EDT ----- Have him in.  Plantar fasciitis. 06/28/2016-Left message with Dr. Geryl RankinsHyatt's orders.

## 2016-07-10 ENCOUNTER — Ambulatory Visit (INDEPENDENT_AMBULATORY_CARE_PROVIDER_SITE_OTHER): Payer: 59 | Admitting: Podiatry

## 2016-07-10 ENCOUNTER — Encounter: Payer: Self-pay | Admitting: Podiatry

## 2016-07-10 DIAGNOSIS — M722 Plantar fascial fibromatosis: Secondary | ICD-10-CM

## 2016-07-10 NOTE — Patient Instructions (Signed)
Pre-Operative Instructions  Congratulations, you have decided to take an important step to improving your quality of life.  You can be assured that the doctors of [facility] will be with you every step of the way.  1. Plan to be at the surgery center/hospital at least 1 (one) hour prior to your scheduled time unless otherwise directed by the surgical center/hospital staff.  You must have a responsible adult accompany you, remain during the surgery and drive you home.  Make sure you have directions to the surgical center/hospital and know how to get there on time. 2. For hospital based surgery you will need to obtain a history and physical form from your family physician within 1 month prior to the date of surgery- we will give you a form for you primary physician.  3. We make every effort to accommodate the date you request for surgery.  There are however, times where surgery dates or times have to be moved.  We will contact you as soon as possible if a change in schedule is required.   4. No Aspirin/Ibuprofen for one week before surgery.  If you are on aspirin, any non-steroidal anti-inflammatory medications (Mobic, Aleve, Ibuprofen) you should stop taking it 7 days prior to your surgery.  You make take Tylenol  For pain prior to surgery.  5. Medications- If you are taking daily heart and blood pressure medications, seizure, reflux, allergy, asthma, anxiety, pain or diabetes medications, make sure the surgery center/hospital is aware before the day of surgery so they may notify you which medications to take or avoid the day of surgery. 6. No food or drink after midnight the night before surgery unless directed otherwise by surgical center/hospital staff. 7. No alcoholic beverages 24 hours prior to surgery.  No smoking 24 hours prior to or 24 hours after surgery. 8. Wear loose pants or shorts- loose enough to fit over bandages, boots, and casts. 9. No slip on shoes, sneakers are best. 10. Bring  your boot with you to the surgery center/hospital.  Also bring crutches or a walker if your physician has prescribed it for you.  If you do not have this equipment, it will be provided for you after surgery. 11. If you have not been contracted by the surgery center/hospital by the day before your surgery, call to confirm the date and time of your surgery. 12. Leave-time from work may vary depending on the type of surgery you have.  Appropriate arrangements should be made prior to surgery with your employer. 13. Prescriptions will be provided immediately following surgery by your doctor.  Have these filled as soon as possible after surgery and take the medication as directed. 14. Remove nail polish on the operative foot. 15. Wash the night before surgery.  The night before surgery wash the foot and leg well with the antibacterial soap provided and water paying special attention to beneath the toenails and in between the toes.  Rinse thoroughly with water and dry well with a towel.  Perform this wash unless told not to do so by your physician.  Enclosed: 1 Ice pack (please put in freezer the night before surgery)   1 Hibiclens skin cleaner   Pre-op Instructions  If you have any questions regarding the instructions, do not hesitate to call our office.  [place]: [address] [place], [place] [address] [phone]  [place]: [address] [place], [place] [address] [phone]  [place]: [address]  [place], [place] [address] [phone]   Dr.   Norman Regal DPM, Dr. Matthew Wagoner DPM, Dr. M. Todd Averil Digman DPM, Dr. Titorya Stover DPM 

## 2016-07-10 NOTE — Progress Notes (Signed)
He presents today for follow-up of his MRI report regarding his left foot. He states that his foot is worse no better.  Objective: Vital signs are stable he is alert and oriented 3. Pulses are palpable. He still has pain on palpation medially located tubercle of the left heel. Central calcaneal plantar tubercle also noted. MRI states that a central tear of the plantar fascia at its insertion site is noted.  Assessment: Chronic proximal plantar fasciitis with partial tearing of the central band.  Plan: Consented him for endoscopic plantar fasciotomy of the central and medial bands of the plantar fascia today. He understands this and is amenable to it we did discuss the possible postop complications which may include but not limited to postop pain bleeding swelling infection recurrence need for further surgery.  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 Follow-up with him in the near future for surgery.

## 2016-07-20 HISTORY — PX: OTHER SURGICAL HISTORY: SHX169

## 2016-08-08 ENCOUNTER — Other Ambulatory Visit: Payer: Self-pay | Admitting: Podiatry

## 2016-08-08 MED ORDER — PROMETHAZINE HCL 25 MG PO TABS: 25.0000 mg | ORAL_TABLET | Freq: Three times a day (TID) | ORAL | 0 refills | Status: DC | PRN

## 2016-08-08 MED ORDER — CEPHALEXIN 500 MG PO CAPS: 500.0000 mg | ORAL_CAPSULE | Freq: Three times a day (TID) | ORAL | 0 refills | Status: DC

## 2016-08-08 MED ORDER — OXYCODONE-ACETAMINOPHEN 10-325 MG PO TABS
1.0000 | ORAL_TABLET | Freq: Four times a day (QID) | ORAL | 0 refills | Status: DC | PRN
Start: 2016-08-08 — End: 2017-09-12

## 2016-08-10 ENCOUNTER — Encounter: Payer: Self-pay | Admitting: Podiatry

## 2016-08-10 DIAGNOSIS — M722 Plantar fascial fibromatosis: Secondary | ICD-10-CM | POA: Diagnosis not present

## 2016-08-10 NOTE — Progress Notes (Signed)
DOS 09.22.2017 Endoscopic Plantar Fasciotomy Left Foot Central and Medial Bands

## 2016-08-17 ENCOUNTER — Ambulatory Visit (INDEPENDENT_AMBULATORY_CARE_PROVIDER_SITE_OTHER): Payer: 59 | Admitting: Podiatry

## 2016-08-17 DIAGNOSIS — M722 Plantar fascial fibromatosis: Secondary | ICD-10-CM

## 2016-08-17 DIAGNOSIS — Z9889 Other specified postprocedural states: Secondary | ICD-10-CM

## 2016-08-17 NOTE — Progress Notes (Signed)
Subjective: Vernon Charles is a 45 y.o. is seen today in office s/p left EPF preformed on 08/10/16. States he is doing well and is having no pain. He is not taking any pain medicine. He has been sleeping the night splint.  Denies any systemic complaints such as fevers, chills, nausea, vomiting. No calf pain, chest pain, shortness of breath.   Objective: General: No acute distress, AAOx3  DP/PT pulses palpable 2/4, CRT < 3 sec to all digits.  Protective sensation intact. Motor function intact.  Right foot: Incision is well coapted without any evidence of dehiscence and sutures itnact. There is no surrounding erythema, ascending cellulitis, fluctuance, crepitus, malodor, drainage/purulence. There is minimal  edema around the surgical site. There is no pain along the surgical site.  No other areas of tenderness to bilateral lower extremities.  No other open lesions or pre-ulcerative lesions.  No pain with calf compression, swelling, warmth, erythema.   Assessment and Plan:  Status post right EPF, doing well with no complications   -Treatment options discussed including all alternatives, risks, and complications -Antibiotic ointment and bandage was applied. -Continue to sleep in night splint and wear CAM boot -Ice/elevation -Pain medication as needed. -Monitor for any clinical signs or symptoms of infection and DVT/PE and directed to call the office immediately should any occur or go to the ER. -Follow-up in 1 week or sooner if any problems arise. In the meantime, encouraged to call the office with any questions, concerns, change in symptoms.   Vernon CurdMatthew Janelle Charles, DPM

## 2016-08-23 ENCOUNTER — Ambulatory Visit (INDEPENDENT_AMBULATORY_CARE_PROVIDER_SITE_OTHER): Payer: 59 | Admitting: Podiatry

## 2016-08-23 ENCOUNTER — Encounter: Payer: Self-pay | Admitting: Podiatry

## 2016-08-23 DIAGNOSIS — M722 Plantar fascial fibromatosis: Secondary | ICD-10-CM | POA: Diagnosis not present

## 2016-08-23 DIAGNOSIS — Z9889 Other specified postprocedural states: Secondary | ICD-10-CM

## 2016-08-23 NOTE — Progress Notes (Signed)
He presents today 2 weeks status post EPF left foot. He states that it is doing great. He denies any complications. He denies any pain. He denies any shortness of breath or tightness in his chest.  Objective: Dry sterile dressing was removed demonstrates sutures are intact margins were coapted no signs of infection. Sutures were removed today.  Assessment: Healing endoscopic plantar fasciotomy left foot.  Plan: I encouraged him to massage the plantar aspect of the heel to help radial scar tissue. I also encouraged him to wear the night splint for 1 month. At this point I will allow him to start trying to get back into his regular shoe gear and he is to wear the Cam Walker if need be. We also placed in a compression anklet and I'll follow-up with him in 2 weeks

## 2016-09-07 ENCOUNTER — Telehealth: Payer: Self-pay | Admitting: *Deleted

## 2016-09-07 DIAGNOSIS — M79605 Pain in left leg: Secondary | ICD-10-CM

## 2016-09-07 NOTE — Telephone Encounter (Addendum)
[place] and began to have calf and behind the knee pain. Pt states the pain is worse at rest and better when standing, is alternating between cam boot and athletic shoe. Pt states the last 2 nights severe pain has woke him up and he couldn't get it resolved.[place] Dr. Ardelle AntonWagoner ordered 81mg  aspirin daily, and venous doppler. I spoke with Falecha - [facility] no availabilities until Wednesday, Juliette AlcideMelinda - [facility] states 09/10/2016 at 1:00pm. Informed pt of Dr. Gabriel RungWagoner's orders and appt with [facility]. 09/10/2016-Kathy - [facility] states venous doppler negative for DVT.  I instructed Olegario MessierKathy to inform pt and we will see for scheduled appt tomorrow.

## 2016-09-10 ENCOUNTER — Ambulatory Visit (HOSPITAL_COMMUNITY)
Admission: RE | Admit: 2016-09-10 | Discharge: 2016-09-10 | Disposition: A | Discharge status: Home / Self Care | Payer: 59 | Source: Ambulatory Visit | Attending: Surgery | Admitting: Surgery

## 2016-09-10 DIAGNOSIS — M79605 Pain in left leg: Secondary | ICD-10-CM

## 2016-09-11 ENCOUNTER — Encounter: Payer: Self-pay | Admitting: Podiatry

## 2016-09-11 ENCOUNTER — Ambulatory Visit (INDEPENDENT_AMBULATORY_CARE_PROVIDER_SITE_OTHER): Payer: 59 | Admitting: Podiatry

## 2016-09-11 DIAGNOSIS — M722 Plantar fascial fibromatosis: Secondary | ICD-10-CM

## 2016-09-11 DIAGNOSIS — Z9889 Other specified postprocedural states: Secondary | ICD-10-CM

## 2016-09-11 NOTE — Progress Notes (Signed)
He presents today for follow-up of his endoscopic plantar fasciotomy of his left foot. Date of surgery is 08/10/2016. He states that is doing very well in regular shoes for about a week or so he was having pain in his calf and behind his knee he was tested for DVT which was negative. He states that he is doing just fine without complications.  Objective: Vital signs are stable alert and oriented 3. Pulses are palpable. Neurologic sensorium is intact he has no reproducible pain on palpation of the left foot. No pain on palpation of the calf or behind the knee. It is not warm to the touch. Pulses remain palpable.  Assessment: Well-healing endoscopic plantar fasciotomy left foot.  Plan: We'll allow him to start getting back to his regular routine there I encouraged encouraged him not to be overly active on this foot for at least another month or so. I will follow-up with him at that time.

## 2016-10-04 ENCOUNTER — Ambulatory Visit (INDEPENDENT_AMBULATORY_CARE_PROVIDER_SITE_OTHER): Payer: 59 | Admitting: Podiatry

## 2016-10-04 DIAGNOSIS — Z9889 Other specified postprocedural states: Secondary | ICD-10-CM

## 2016-10-04 DIAGNOSIS — M722 Plantar fascial fibromatosis: Secondary | ICD-10-CM

## 2016-10-04 NOTE — Progress Notes (Signed)
Subjective:     Patient ID: Vernon Charles, male   DOB: [birth year], 45 y.o.   MRN: [id number]  HPI patient presents stating his left heel is doing very well with minimal discomfort   Review of Systems     Objective:   Physical Exam Neurovascular status intact negative Homans sign noted with incision sites medial and lateral doing very well with minimal discomfort in the heel or arch    Assessment:     Doing well post endoscopic surgery left    Plan:     Instructed on physical therapy anti-inflammatories and gradual return to soft shoes. Patient be seen back to recheck

## 2017-08-05 ENCOUNTER — Other Ambulatory Visit: Payer: Self-pay | Admitting: Occupational Medicine

## 2017-08-05 ENCOUNTER — Ambulatory Visit: Payer: Self-pay

## 2017-08-05 DIAGNOSIS — R0781 Pleurodynia: Secondary | ICD-10-CM

## 2017-09-06 ENCOUNTER — Emergency Department (HOSPITAL_COMMUNITY): Payer: 59

## 2017-09-06 ENCOUNTER — Encounter (HOSPITAL_COMMUNITY): Payer: Self-pay | Admitting: Emergency Medicine

## 2017-09-06 ENCOUNTER — Emergency Department (HOSPITAL_COMMUNITY)
Admission: EM | Admit: 2017-09-06 | Discharge: 2017-09-06 | Disposition: A | Discharge status: Home / Self Care | Payer: 59 | Attending: Emergency Medicine | Admitting: Emergency Medicine

## 2017-09-06 DIAGNOSIS — R079 Chest pain, unspecified: Secondary | ICD-10-CM | POA: Diagnosis present

## 2017-09-06 DIAGNOSIS — Z5321 Procedure and treatment not carried out due to patient leaving prior to being seen by health care provider: Secondary | ICD-10-CM | POA: Diagnosis not present

## 2017-09-06 HISTORY — DX: Unspecified intracranial injury with loss of consciousness of unspecified duration, initial encounter: S06.9X9A

## 2017-09-06 HISTORY — DX: Post-traumatic stress disorder, unspecified: F43.10

## 2017-09-06 HISTORY — DX: Panic disorder (episodic paroxysmal anxiety): F41.0

## 2017-09-06 LAB — CBC
HCT: 42.9 % (ref 39.0–52.0)
HEMOGLOBIN: 14.7 g/dL (ref 13.0–17.0)
MCH: 30.3 pg (ref 26.0–34.0)
MCHC: 34.3 g/dL (ref 30.0–36.0)
MCV: 88.5 fL (ref 78.0–100.0)
PLATELETS: 217 10*3/uL (ref 150–400)
RBC: 4.85 MIL/uL (ref 4.22–5.81)
RDW: 12.1 % (ref 11.5–15.5)
WBC: 6 10*3/uL (ref 4.0–10.5)

## 2017-09-06 LAB — BASIC METABOLIC PANEL
Anion gap: 8 (ref 5–15)
BUN: 15 mg/dL (ref 6–20)
CALCIUM: 9.1 mg/dL (ref 8.9–10.3)
CHLORIDE: 104 mmol/L (ref 101–111)
CO2: 26 mmol/L (ref 22–32)
CREATININE: 1.23 mg/dL (ref 0.61–1.24)
GFR calc Af Amer: >60 mL/min (ref >60)
GFR calc non Af Amer: >60 mL/min (ref >60)
GLUCOSE: 92 mg/dL (ref 65–99)
Potassium: 3.7 mmol/L (ref 3.5–5.1)
Sodium: 138 mmol/L (ref 135–145)

## 2017-09-06 LAB — I-STAT TROPONIN, ED: TROPONIN I, POC: 0 ng/mL (ref 0.00–0.08)

## 2017-09-06 NOTE — ED Notes (Signed)
Called pt for vitals X3 no answer

## 2017-09-06 NOTE — ED Triage Notes (Signed)
Pt presents with central and left sided CP that began yestrday and dizziness that has been ongoing for couple weeks; pt denies any other focal deficits; pt also reports palpitations and nausea with the CP, no hx of cardiac issues

## 2017-09-12 ENCOUNTER — Encounter: Payer: Self-pay | Admitting: *Deleted

## 2017-09-12 ENCOUNTER — Encounter (INDEPENDENT_AMBULATORY_CARE_PROVIDER_SITE_OTHER): Payer: Self-pay

## 2017-09-12 ENCOUNTER — Ambulatory Visit (INDEPENDENT_AMBULATORY_CARE_PROVIDER_SITE_OTHER): Payer: 59 | Admitting: Cardiology

## 2017-09-12 ENCOUNTER — Encounter: Payer: Self-pay | Admitting: Cardiology

## 2017-09-12 VITALS — BP 124/82 | HR 92 | Ht 72.0 in | Wt 200.0 lb

## 2017-09-12 DIAGNOSIS — R0600 Dyspnea, unspecified: Secondary | ICD-10-CM

## 2017-09-12 DIAGNOSIS — R0609 Other forms of dyspnea: Secondary | ICD-10-CM

## 2017-09-12 DIAGNOSIS — R002 Palpitations: Secondary | ICD-10-CM | POA: Diagnosis not present

## 2017-09-12 MED ORDER — ASPIRIN EC 81 MG PO TBEC: 81.0000 mg | DELAYED_RELEASE_TABLET | Freq: Every day | ORAL | 3 refills | Status: DC

## 2017-09-12 MED ORDER — LOSARTAN POTASSIUM 25 MG PO TABS: 12.5000 mg | ORAL_TABLET | Freq: Every day | ORAL | 3 refills | Status: DC

## 2017-09-12 MED ORDER — SPIRONOLACTONE 25 MG PO TABS: 12.5000 mg | ORAL_TABLET | Freq: Every day | ORAL | 3 refills | Status: DC

## 2017-09-12 NOTE — Addendum Note (Signed)
Addended by: Lars MassonNELSON, Grant Swager H on: 09/12/2017 04:38 PM   Modules accepted: Orders, SmartSet

## 2017-09-12 NOTE — Patient Instructions (Addendum)
Medication Instructions:   START TAKING LOSARTAN 12.5 MG ONCE DAILY  START TAKING SPIRONOLACTONE 12.5 MG ONCE DAILY  START TAKING ASPIRIN 81 MG ONCE DAILY   Labwork:  PRE-CATH LABS WERE DONE AT PATIENTS PCP OFFICE AND REVIEWED BY DR NELSON--ALL LABS NORMAL--PATIENT WILL BRING THE LAB DOCUMENTS TO THE CATH LAB TOMORROW 10/26, SO THEY CAN HAVE THESE RESULTS ON FILE    Testing/Procedures:  Your physician has requested that you have a cardiac catheterization. Cardiac catheterization is used to diagnose and/or treat various heart conditions. Doctors may recommend this procedure for a number of different reasons. The most common reason is to evaluate chest pain. Chest pain can be a symptom of coronary artery disease (CAD), and cardiac catheterization can show whether plaque is narrowing or blocking your heart's arteries. This procedure is also used to evaluate the valves, as well as measure the blood flow and oxygen levels in different parts of your heart. For further information please visit [email or web address] Please follow instruction sheet, as given. YOUR CARDIAC CATH IS SCHEDULED FOR TOMORROW 09/13/17 AT 1:30 PM FOR DR BENSIMHON TO DO.  PLEASE ARRIVE AT THE [place] OF [place] AT 1:30 PM.    Your physician has recommended that you wear a 48 HOUR  holter monitor. Holter monitors are medical devices that record the heart's electrical activity. Doctors most often use these monitors to diagnose arrhythmias. Arrhythmias are problems with the speed or rhythm of the heartbeat. The monitor is a small, portable device. You can wear one while you do your normal daily activities. This is usually used to diagnose what is causing palpitations/syncope (passing out).     Follow-Up:  WILL BE ARRANGED BASED ON YOUR CATH RESULTS TOMORROW--WILL BE ARRANGED AT DISCHARGE       If you need a refill on your cardiac medications before your next appointment, please call your pharmacy.

## 2017-09-12 NOTE — Progress Notes (Addendum)
Cardiology Office Note    Date:  10/08/2017   ID:  Vernon Charles, DOB 12-Feb-1971, MRN [id number]  PCP:  Marden Noble, MD  Cardiologist:  Tobias Alexander, MD   Chief complaint: Dyspnea on exertion, palpitations.  History of Present Illness:  Vernon Charles is a 46 y.o. 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 He was in his usual state of health until about months ago when he fell off the stairs while on duty, per patient he was walking in the rain and didn't loose consciousness or no associated palpitations, he fractured wrist ribs, he was very bulletproof vest at the time that prevented further damage. However his pain persistedOn further questioning patient states that he has been experiencing worsening exertional dyspnea in the last months associated with dizziness and chest tightness. He wakes up almost every night feeling short of breath and significant palpitations. He denies any syncope no lower extremity edema. He states that about a year ago when he was at a conference in [place] he new significant palpitations and felt the room was spinning around him but he didn't pass out. He has never had syncopal episode before. Right now he is experiencing palpitations daily. He denies any acute illness recently no fever or chills. 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  He doesn't have any family members who had died suddenly or of heart failure. His father died of stroke at age 15, he had myocardial infarction at age of 96. His mother died at age of 78 of lung disease. He has 3 sons and 1 daughter all healthy.  He's not taking any medications on a regular basis, doesn't use any alcohol, illicit drugs, he has never smoked. Until about 1 month ago he felt perfectly normal never experienced chest pain or shortness of breath before.    Past Medical History:  Diagnosis Date  . Allergic   . Chest pain    . Diarrheal disease 2015  . Lower back pain   . Pain in the abdomen 01/2015  . Palpitations   . Panic attack   . Plantar fasciitis, bilateral   . PTSD (post-traumatic stress disorder)   . Rupture of right plantaris tendon 2015  . Shingles   . TBI (traumatic brain injury) (HCC) 2006    Past Surgical History:  Procedure Laterality Date  . ANKLE SURGERY     left 1989  . NOSE SURGERY  1985  . plantar facsciotomy  07/2016  . RIGHT/LEFT HEART CATH AND CORONARY ANGIOGRAPHY N/A 09/13/2017   Performed by Dolores Patty, MD at [facility] INVASIVE CV LAB  . SHOULDER SURGERY     right shoulder- 2 surgery 1985 and 2011   Current Medications: Outpatient Medications Prior to Visit  Medication Sig Dispense Refill  . ibuprofen (ADVIL,MOTRIN) 800 MG tablet Take 1 tablet (800 mg total) by mouth every 6 (six) hours as needed for pain. 30 tablet 0  . diclofenac (VOLTAREN) 75 MG EC tablet TAKE 1 TABLET(75 MG) BY MOUTH TWICE DAILY (Patient not taking: Reported on 09/12/2017) 180 tablet 0  . oxyCODONE-acetaminophen (PERCOCET) 10-325 MG tablet Take 1 tablet by mouth every 6 (six) hours as needed for pain. (Patient not taking: Reported on 09/12/2017) 30 tablet 0   No facility-administered medications prior to visit.     Allergies:   Patient has no known allergies.   Social History   Socioeconomic History  .  Marital status: Married    Spouse name: None  . Number of children: None  . Years of education: None  . Highest education level: None  Social Needs  . Financial resource strain: None  . Food insecurity - worry: None  . Food insecurity - inability: None  . Transportation needs - medical: None  . Transportation needs - non-medical: None  Occupational History  . Occupation: Sheriff's office    Employer: [employer]  Tobacco Use  . Smoking status: Former Smoker    Start date: 09/12/2001  . Smokeless tobacco: Never Used  Substance and Sexual Activity  . Alcohol use: Yes    Comment:  socially  . Drug use: No  . Sexual activity: None    Comment: married  Other Topics Concern  . None  Social History Narrative  . None    Family History:  The patient's family history includes CAD in his father; COPD in his mother; CVA in his father; Heart attack in his father; Hodgkin's lymphoma in his mother; Lung disease in his mother.   ROS:   Please see the history of present illness.    ROS All other systems reviewed and are negative.   PHYSICAL EXAM:   VS:  BP 124/82   Pulse 92   Ht 6' (1.829 m)   Wt 200 lb (90.7 kg)   SpO2 96%   BMI 27.12 kg/m    GEN: Well nourished, well developed, in no acute distress  HEENT: normal  Neck: no JVD, carotid bruits, or masses Cardiac: RRR; no murmurs, rubs, or gallops,no edema  Respiratory:  clear to auscultation bilaterally, decreased breathing sounds at both bases GI: soft, nontender, nondistended, + BS MS: no deformity or atrophy  Skin: warm and dry, no rash Neuro:  Alert and Oriented x 3, Strength and sensation are intact Psych: euthymic mood, full affect  Wt Readings from Last 3 Encounters:  09/13/17 192 lb (87.1 kg)  09/12/17 200 lb (90.7 kg)  09/06/17 192 lb (87.1 kg)     Studies/Labs Reviewed:   EKG:  EKG is not ordered today.  ECG from [place] office shows SR, poor R wave progressin in the anterior leads.  Recent Labs: 09/06/2017: BUN 15; Creatinine, Ser 1.23; Hemoglobin 14.7; Platelets 217; Potassium 3.7; Sodium 138   Lipid Panel No results found for: CHOL, TRIG, HDL, CHOLHDL, VLDL, LDLCALC, LDLDIRECT  Additional studies/ records that were reviewed today include:      ASSESSMENT:    1. Palpitations   2. DOE (dyspnea on exertion)      PLAN:  In order of problems listed above:   3. We will arrange for a 48 hour Holter monitor to evaluate palpitations and possible VTs. 4. Thanks to CHF team for feeling to participate in his workup and care. 5. He's for now advised not to participate in part of his jobs  as a Chief Technology Officersniper or active [employer] duty but continue with his office job and restrained from strenuous physical activity.  Medication Adjustments/Labs and Tests Ordered: Current medicines are reviewed at length with the patient today.  Concerns regarding medicines are outlined above.  Medication changes, Labs and Tests ordered today are listed in the Patient Instructions below. Patient Instructions  Medication Instructions:      Labwork:  PRE-CATH LABS WERE DONE AT PATIENTS PCP OFFICE AND REVIEWED BY DR Analiyah Lechuga--ALL LABS NORMAL--PATIENT WILL BRING THE LAB DOCUMENTS TO THE CATH LAB TOMORROW 10/26, SO THEY CAN HAVE THESE RESULTS ON FILE    Testing/Procedures:  Your physician has requested that you have a cardiac catheterization. Cardiac catheterization is used to diagnose and/or treat various heart conditions. Doctors may recommend this procedure for a number of different reasons. The most common reason is to evaluate chest pain. Chest pain can be a symptom of coronary artery disease (CAD), and cardiac catheterization can show whether plaque is narrowing or blocking your heart's arteries. This procedure is also used to evaluate the valves, as well as measure the blood flow and oxygen levels in different parts of your heart. For further information please visit [email or web address] Please follow instruction sheet, as given. YOUR CARDIAC CATH IS SCHEDULED FOR TOMORROW 09/13/17 AT 1:30 PM FOR DR BENSIMHON TO DO.  PLEASE ARRIVE AT THE [place]TH TOWER OF [place] AT 1:30 PM.    Your physician has recommended that you wear a 48 HOUR  holter monitor. Holter monitors are medical devices that record the heart's electrical activity. Doctors most often use these monitors to diagnose arrhythmias. Arrhythmias are problems with the speed or rhythm of the heartbeat. The monitor is a small, portable device. You can wear one while you do your normal daily activities. This is usually used to diagnose what is causing  palpitations/syncope (passing out).     Follow-Up:  WILL BE ARRANGED BASED ON YOUR CATH RESULTS TOMORROW--WILL BE ARRANGED AT DISCHARGE       If you need a refill on your cardiac medications before your next appointment, please call your pharmacy.      Signed, Tobias Alexander, MD  10/08/2017 8:54 AM    [facility] [address] [place], [place], [place]  [address] Phone: [phone]; Fax: [phone]

## 2017-09-13 ENCOUNTER — Encounter (HOSPITAL_COMMUNITY): Payer: Self-pay

## 2017-09-13 ENCOUNTER — Ambulatory Visit (HOSPITAL_COMMUNITY)
Admission: RE | Disposition: A | Discharge status: Home / Self Care | Payer: Self-pay | Source: Ambulatory Visit | Attending: Internal Medicine

## 2017-09-13 ENCOUNTER — Ambulatory Visit (HOSPITAL_COMMUNITY)
Admission: RE | Admit: 2017-09-13 | Discharge: 2017-09-13 | Disposition: A | Discharge status: Home / Self Care | Payer: 59 | Source: Ambulatory Visit | Attending: Internal Medicine | Admitting: Internal Medicine

## 2017-09-13 DIAGNOSIS — Z8249 Family history of ischemic heart disease and other diseases of the circulatory system: Secondary | ICD-10-CM | POA: Diagnosis not present

## 2017-09-13 DIAGNOSIS — Z87891 Personal history of nicotine dependence: Secondary | ICD-10-CM | POA: Insufficient documentation

## 2017-09-13 DIAGNOSIS — Z87820 Personal history of traumatic brain injury: Secondary | ICD-10-CM | POA: Diagnosis not present

## 2017-09-13 DIAGNOSIS — F4310 Post-traumatic stress disorder, unspecified: Secondary | ICD-10-CM | POA: Diagnosis not present

## 2017-09-13 DIAGNOSIS — I429 Cardiomyopathy, unspecified: Secondary | ICD-10-CM | POA: Diagnosis not present

## 2017-09-13 DIAGNOSIS — I5021 Acute systolic (congestive) heart failure: Secondary | ICD-10-CM

## 2017-09-13 DIAGNOSIS — M722 Plantar fascial fibromatosis: Secondary | ICD-10-CM | POA: Diagnosis not present

## 2017-09-13 DIAGNOSIS — I5043 Acute on chronic combined systolic (congestive) and diastolic (congestive) heart failure: Secondary | ICD-10-CM | POA: Diagnosis present

## 2017-09-13 HISTORY — PX: RIGHT/LEFT HEART CATH AND CORONARY ANGIOGRAPHY: CATH118266

## 2017-09-13 LAB — POCT I-STAT 3, VENOUS BLOOD GAS (G3P V)
ACID-BASE EXCESS: 1 mmol/L (ref 0.0–2.0)
Acid-Base Excess: 1 mmol/L (ref 0.0–2.0)
Acid-base deficit: 1 mmol/L (ref 0.0–2.0)
BICARBONATE: 25.7 mmol/L (ref 20.0–28.0)
Bicarbonate: 26.2 mmol/L (ref 20.0–28.0)
Bicarbonate: 23.5 mmol/L (ref 20.0–28.0)
O2 Saturation: 75 %
O2 Saturation: 76 %
O2 Saturation: 74 %
PCO2 VEN: 39.5 mmHg — AB (ref 44.0–60.0)
PH VEN: 7.396 (ref 7.250–7.430)
PO2 VEN: 40 mmHg (ref 32.0–45.0)
TCO2: 27 mmol/L (ref 22–32)
TCO2: 27 mmol/L (ref 22–32)
TCO2: 25 mmol/L (ref 22–32)
pCO2, Ven: 42.5 mmHg — ABNORMAL LOW (ref 44.0–60.0)
pCO2, Ven: 42.7 mmHg — ABNORMAL LOW (ref 44.0–60.0)
pH, Ven: 7.39 (ref 7.250–7.430)
pH, Ven: 7.383 (ref 7.250–7.430)
pO2, Ven: 41 mmHg (ref 32.0–45.0)
pO2, Ven: 41 mmHg (ref 32.0–45.0)

## 2017-09-13 LAB — POCT I-STAT 3, ART BLOOD GAS (G3+)
BICARBONATE: 24.5 mmol/L (ref 20.0–28.0)
O2 Saturation: 94 %
PCO2 ART: 39 mmHg (ref 32.0–48.0)
PH ART: 7.405 (ref 7.350–7.450)
TCO2: 26 mmol/L (ref 22–32)
pO2, Arterial: 71 mmHg — ABNORMAL LOW (ref 83.0–108.0)

## 2017-09-13 LAB — PROTIME-INR
INR: 1.03
Prothrombin Time: 13.4 seconds (ref 11.4–15.2)

## 2017-09-13 SURGERY — RIGHT/LEFT HEART CATH AND CORONARY ANGIOGRAPHY
Anesthesia: LOCAL

## 2017-09-13 MED ORDER — SODIUM CHLORIDE 0.9 % WEIGHT BASED INFUSION: 1.0000 mL/kg/h | INTRAVENOUS | Status: DC

## 2017-09-13 MED ORDER — IOPAMIDOL (ISOVUE-370) INJECTION 76%
INTRAVENOUS | Status: AC
  Filled 2017-09-13: qty 100

## 2017-09-13 MED ORDER — VERAPAMIL HCL 2.5 MG/ML IV SOLN
INTRAVENOUS | Status: AC
  Filled 2017-09-13: qty 2

## 2017-09-13 MED ORDER — SODIUM CHLORIDE 0.9% FLUSH: 3.0000 mL | INTRAVENOUS | Status: DC | PRN

## 2017-09-13 MED ORDER — ASPIRIN 81 MG PO CHEW: 81.0000 mg | CHEWABLE_TABLET | ORAL | Status: DC

## 2017-09-13 MED ORDER — MIDAZOLAM HCL 2 MG/2ML IJ SOLN
INTRAMUSCULAR | Status: AC
  Filled 2017-09-13: qty 2

## 2017-09-13 MED ORDER — LIDOCAINE HCL 2 % IJ SOLN
INTRAMUSCULAR | Status: AC
Start: 2017-09-13 — End: ?
  Filled 2017-09-13: qty 20

## 2017-09-13 MED ORDER — VERAPAMIL HCL 2.5 MG/ML IV SOLN
INTRAVENOUS | Status: DC | PRN
  Administered 2017-09-13: 10 mL via INTRA_ARTERIAL

## 2017-09-13 MED ORDER — SODIUM CHLORIDE 0.9% FLUSH: 3.0000 mL | Freq: Two times a day (BID) | INTRAVENOUS | Status: DC

## 2017-09-13 MED ORDER — ONDANSETRON HCL 4 MG/2ML IJ SOLN: 4.0000 mg | Freq: Four times a day (QID) | INTRAMUSCULAR | Status: DC | PRN

## 2017-09-13 MED ORDER — MIDAZOLAM HCL 2 MG/2ML IJ SOLN
INTRAMUSCULAR | Status: DC | PRN
  Administered 2017-09-13: 1 mg via INTRAVENOUS
  Administered 2017-09-13: 2 mg via INTRAVENOUS

## 2017-09-13 MED ORDER — IOPAMIDOL (ISOVUE-370) INJECTION 76%
INTRAVENOUS | Status: DC | PRN
  Administered 2017-09-13: 35 mL via INTRA_ARTERIAL

## 2017-09-13 MED ORDER — HEPARIN SODIUM (PORCINE) 1000 UNIT/ML IJ SOLN
INTRAMUSCULAR | Status: AC
  Filled 2017-09-13: qty 1

## 2017-09-13 MED ORDER — FENTANYL CITRATE (PF) 100 MCG/2ML IJ SOLN
INTRAMUSCULAR | Status: DC | PRN
  Administered 2017-09-13 (×2): 25 ug via INTRAVENOUS

## 2017-09-13 MED ORDER — SODIUM CHLORIDE 0.9 % IV SOLN: 250.0000 mL | INTRAVENOUS | Status: DC | PRN

## 2017-09-13 MED ORDER — HEPARIN (PORCINE) IN NACL 2-0.9 UNIT/ML-% IJ SOLN
INTRAMUSCULAR | Status: AC | PRN
  Administered 2017-09-13: 1000 mL

## 2017-09-13 MED ORDER — HEPARIN (PORCINE) IN NACL 2-0.9 UNIT/ML-% IJ SOLN
INTRAMUSCULAR | Status: AC
  Filled 2017-09-13: qty 1000

## 2017-09-13 MED ORDER — HEPARIN SODIUM (PORCINE) 1000 UNIT/ML IJ SOLN
INTRAMUSCULAR | Status: DC | PRN
Start: 2017-09-13 — End: 2017-09-13
  Administered 2017-09-13: 4000 [IU] via INTRAVENOUS

## 2017-09-13 MED ORDER — FENTANYL CITRATE (PF) 100 MCG/2ML IJ SOLN
INTRAMUSCULAR | Status: AC
  Filled 2017-09-13: qty 2

## 2017-09-13 MED ORDER — ACETAMINOPHEN 325 MG PO TABS: 650.0000 mg | ORAL_TABLET | ORAL | Status: DC | PRN

## 2017-09-13 MED ORDER — LIDOCAINE HCL (PF) 1 % IJ SOLN
INTRAMUSCULAR | Status: DC | PRN
  Administered 2017-09-13 (×2): 2 mL via INTRADERMAL

## 2017-09-13 MED ORDER — SODIUM CHLORIDE 0.9 % IV SOLN: INTRAVENOUS | Status: DC

## 2017-09-13 MED ORDER — SODIUM CHLORIDE 0.9 % WEIGHT BASED INFUSION
3.0000 mL/kg/h | INTRAVENOUS | Status: AC
  Administered 2017-09-13: 3 mL/kg/h via INTRAVENOUS

## 2017-09-13 SURGICAL SUPPLY — 12 items
CATH BALLN WEDGE 5F 110CM (CATHETERS) ×1 IMPLANT
CATH IMPULSE 5F ANG/FL3.5 (CATHETERS) ×1 IMPLANT
DEVICE RAD COMP TR BAND LRG (VASCULAR PRODUCTS) ×1 IMPLANT
GLIDESHEATH SLEND SS 6F .021 (SHEATH) ×1 IMPLANT
GUIDEWIRE INQWIRE 1.5J.035X260 (WIRE) IMPLANT
INQWIRE 1.5J .035X260CM (WIRE) ×2
KIT HEART LEFT (KITS) ×2 IMPLANT
PACK CARDIAC CATHETERIZATION (CUSTOM PROCEDURE TRAY) ×2 IMPLANT
SHEATH GLIDE SLENDER 4/5FR (SHEATH) ×1 IMPLANT
TRANSDUCER W/STOPCOCK (MISCELLANEOUS) ×2 IMPLANT
TUBING CIL FLEX 10 FLL-RA (TUBING) ×2 IMPLANT
WIRE EMERALD 3MM-J .025X260CM (WIRE) ×1 IMPLANT

## 2017-09-13 NOTE — Discharge Instructions (Signed)
Moderate Conscious Sedation, Adult, Care After These instructions provide you with information about caring for yourself after your procedure. Your health care provider may also give you more specific instructions. Your treatment has been planned according to current medical practices, but problems sometimes occur. Call your health care provider if you have any problems or questions after your procedure. What can I expect after the procedure? After your procedure, it is common:  To feel sleepy for several hours.  To feel clumsy and have poor balance for several hours.  To have poor judgment for several hours.  To vomit if you eat too soon.  Follow these instructions at home: For at least 24 hours after the procedure:   Do not: ? Participate in activities where you could fall or become injured. ? Drive. ? Use heavy machinery. ? Drink alcohol. ? Take sleeping pills or medicines that cause drowsiness. ? Make important decisions or sign legal documents. ? Take care of children on your own.  Rest. Eating and drinking  Follow the diet recommended by your health care provider.  If you vomit: ? Drink water, juice, or soup when you can drink without vomiting. ? Make sure you have little or no nausea before eating solid foods. General instructions  Have a responsible adult stay with you until you are awake and alert.  Take over-the-counter and prescription medicines only as told by your health care provider.  If you smoke, do not smoke without supervision.  Keep all follow-up visits as told by your health care provider. This is important. Contact a health care provider if:  You keep feeling nauseous or you keep vomiting.  You feel light-headed.  You develop a rash.  You have a fever. Get help right away if:  You have trouble breathing. This information is not intended to replace advice given to you by your health care provider. Make sure you discuss any questions you have  with your health care provider. Document Released: 08/26/2013 Document Revised: 04/09/2016 Document Reviewed: 02/25/2016 Elsevier Interactive Patient Education  2018 Elsevier Inc. Radial Site Care Refer to this sheet in the next few weeks. These instructions provide you with information about caring for yourself after your procedure. Your health care provider may also give you more specific instructions. Your treatment has been planned according to current medical practices, but problems sometimes occur. Call your health care provider if you have any problems or questions after your procedure. What can I expect after the procedure? After your procedure, it is typical to have the following:  Bruising at the radial site that usually fades within 1-2 weeks.  Blood collecting in the tissue (hematoma) that may be painful to the touch. It should usually decrease in size and tenderness within 1-2 weeks.  Follow these instructions at home:  Take medicines only as directed by your health care provider.  You may shower 24-48 hours after the procedure or as directed by your health care provider. Remove the bandage (dressing) and gently wash the site with plain soap and water. Pat the area dry with a clean towel. Do not rub the site, because this may cause bleeding.  Do not take baths, swim, or use a hot tub until your health care provider approves.  Check your insertion site every day for redness, swelling, or drainage.  Do not apply powder or lotion to the site.  Do not flex or bend the affected arm for 24 hours or as directed by your health care provider.  Do not   push or pull heavy objects with the affected arm for 24 hours or as directed by your health care provider.  Do not lift over 10 lb (4.5 kg) for 5 days after your procedure or as directed by your health care provider.  Ask your health care provider when it is okay to: ? Return to work or school. ? Resume usual physical activities or  sports. ? Resume sexual activity.  Do not drive home if you are discharged the same day as the procedure. Have someone else drive you.  You may drive 24 hours after the procedure unless otherwise instructed by your health care provider.  Do not operate machinery or power tools for 24 hours after the procedure.  If your procedure was done as an outpatient procedure, which means that you went home the same day as your procedure, a responsible adult should be with you for the first 24 hours after you arrive home.  Keep all follow-up visits as directed by your health care provider. This is important. Contact a health care provider if:  You have a fever.  You have chills.  You have increased bleeding from the radial site. Hold pressure on the site. Get help right away if:  You have unusual pain at the radial site.  You have redness, warmth, or swelling at the radial site.  You have drainage (other than a small amount of blood on the dressing) from the radial site.  The radial site is bleeding, and the bleeding does not stop after 30 minutes of holding steady pressure on the site.  Your arm or hand becomes pale, cool, tingly, or numb. This information is not intended to replace advice given to you by your health care provider. Make sure you discuss any questions you have with your health care provider. Document Released: 12/08/2010 Document Revised: 04/12/2016 Document Reviewed: 05/24/2014 Elsevier Interactive Patient Education  2018 Elsevier Inc.  

## 2017-09-13 NOTE — H&P (View-Only) (Signed)
Cardiology Office Note    Date:  09/12/2017   ID:  Vernon Charles, DOB 1971/05/20, MRN [id number]  PCP:  Marden Noble, MD  Cardiologist:  Tobias Alexander, MD   Chief complaint: Dyspnea on exertion, palpitations.  History of Present Illness:  Vernon Charles is a 46 y.o. 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 He was in his usual state of health until about months ago when he fell off the stairs while on duty, per patient he was walking in the rain and didn't loose consciousness or no associated palpitations, he fractured wrist ribs, he was very bulletproof vest at the time that prevented further damage. However his pain persisted that lead to further workup that revealed biventricular failure with LVEF of 15%. On further questioning patient states that he has been experiencing worsening exertional dyspnea in the last months associated with dizziness and chest tightness. He wakes up almost every night feeling short of breath and significant palpitations. He denies any syncope no lower extremity edema. He states that about a year ago when he was at a conference in [place] he new significant palpitations and felt the room was spinning around him but he didn't pass out. He has never had syncopal episode before. Right now he is experiencing palpitations daily. He denies any acute illness recently no fever or chills. 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  He doesn't have any family members who had died suddenly or of heart failure. His father died of stroke at age 33, he had myocardial infarction at age of 42. His mother died at age of 28 of lung disease. He has 3 sons and 1 daughter all healthy.  He's not taking any medications on a regular basis, doesn't use any alcohol, illicit drugs, he has never smoked. Until about 1 month ago he felt perfectly normal never experienced chest pain or shortness of breath  before.  TTE from 09/09/2017  Left ventricle is normal in size, LVEDD 51 mm, there is mild concentric hypertrophy, LVEF 15% with no regional wall motion abnormalities, left atrium is serially dilated, right ventricle is moderately dilated with moderately reduced systolic function, there is trace aortic mitral and tricuspid regurgitation. There is no pericardial effusion. IVC is dilated. RVSP was not assessed.  Past Medical History:  Diagnosis Date  . Allergic   . Chest pain   . Diarrheal disease 2015  . Lower back pain   . Pain in the abdomen 01/2015  . Palpitations   . Panic attack   . Plantar fasciitis, bilateral   . PTSD (post-traumatic stress disorder)   . Rupture of right plantaris tendon 2015  . Shingles   . TBI (traumatic brain injury) (HCC) 2006    Past Surgical History:  Procedure Laterality Date  . ANKLE SURGERY     left 1989  . NOSE SURGERY  1985  . plantar facsciotomy  07/2016  . SHOULDER SURGERY     right shoulder- 2 surgery 1985 and 2011   Current Medications: Outpatient Medications Prior to Visit  Medication Sig Dispense Refill  . ibuprofen (ADVIL,MOTRIN) 800 MG tablet Take 1 tablet (800 mg total) by mouth every 6 (six) hours as needed for pain. 30 tablet 0  . diclofenac (VOLTAREN) 75 MG EC tablet TAKE 1 TABLET(75 MG) BY MOUTH TWICE DAILY (Patient not taking: Reported on 09/12/2017) 180 tablet 0  . oxyCODONE-acetaminophen (PERCOCET) 10-325 MG  tablet Take 1 tablet by mouth every 6 (six) hours as needed for pain. (Patient not taking: Reported on 09/12/2017) 30 tablet 0   No facility-administered medications prior to visit.     Allergies:   Patient has no known allergies.   Social History   Social History  . Marital status: Married    Spouse name: N/A  . Number of children: N/A  . Years of education: N/A   Occupational History  . [employer]   Social History Main Topics  . Smoking status: Former Smoker    Start date: 09/12/2001    . Smokeless tobacco: Never Used  . Alcohol use Yes     Comment: socially  . Drug use: No  . Sexual activity: Not Asked     Comment: married   Other Topics Concern  . None   Social History Narrative  . None    Family History:  The patient's family history includes CAD in his father; COPD in his mother; CVA in his father; Heart attack in his father; Hodgkin's lymphoma in his mother; Lung disease in his mother.   ROS:   Please see the history of present illness.    ROS All other systems reviewed and are negative.   PHYSICAL EXAM:   VS:  BP 124/82   Pulse 92   Ht 6' (1.829 m)   Wt 200 lb (90.7 kg)   SpO2 96%   BMI 27.12 kg/m    GEN: Well nourished, well developed, in no acute distress  HEENT: normal  Neck: no JVD, carotid bruits, or masses Cardiac: RRR; no murmurs, S4 present, rubs, or gallops,no edema  Respiratory:  clear to auscultation bilaterally, decreased breathing sounds at both bases GI: soft, nontender, nondistended, + BS MS: no deformity or atrophy  Skin: warm and dry, no rash Neuro:  Alert and Oriented x 3, Strength and sensation are intact Psych: euthymic mood, full affect  Wt Readings from Last 3 Encounters:  09/12/17 200 lb (90.7 kg)  09/06/17 192 lb (87.1 kg)  08/03/15 194 lb (88 kg)     Studies/Labs Reviewed:   EKG:  EKG is not ordered today.  ECG from [place] office shows SR, poor R wave progressin in the anterior leads.  Recent Labs: 09/06/2017: BUN 15; Creatinine, Ser 1.23; Hemoglobin 14.7; Platelets 217; Potassium 3.7; Sodium 138   Lipid Panel No results found for: CHOL, TRIG, HDL, CHOLHDL, VLDL, LDLCALC, LDLDIRECT  Additional studies/ records that were reviewed today include:      ASSESSMENT:    1. Acute on chronic combined systolic and diastolic CHF (congestive heart failure) (HCC)   2. Cardiomyopathy, unspecified type (HCC)   3. Palpitations      PLAN:  In order of problems listed above:  1. This patient is in acute on chronic  combined systolic and diastolic heart failure, we don't have available echo images and diastolic function was not described in the report. However based on severely dilated left atrium and onset of symptoms a year ago this is probably a chronic process interval compensated highly active young person. We will arrange for left and right cardiac catheterization tomorrow. His labs are all normal. Optimize for heart failure. If his left sided cardiac cath is normal we will plan for cardiac MRI to further evaluate possible etiology. 2. I will start low-dose losartan 12.5 mg daily and spironolactone 12.5 mg daily. We'll uptitrate as tolerated by his blood pressure. 3. We will arrange for a 48 hour Holter  monitor to evaluate palpitations and possible VTs. 4. Thanks to CHF team for feeling to participate in his workup and care. 5. 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  Medication Adjustments/Labs and Tests Ordered: Current medicines are reviewed at length with the patient today.  Concerns regarding medicines are outlined above.  Medication changes, Labs and Tests ordered today are listed in the Patient Instructions below. Patient Instructions  Medication Instructions:   START TAKING LOSARTAN 12.5 MG ONCE DAILY  START TAKING SPIRONOLACTONE 12.5 MG ONCE DAILY  START TAKING ASPIRIN 81 MG ONCE DAILY   Labwork:  PRE-CATH LABS WERE DONE AT PATIENTS PCP OFFICE AND REVIEWED BY DR Kimesha Claxton--ALL LABS NORMAL--PATIENT WILL BRING THE LAB DOCUMENTS TO THE CATH LAB TOMORROW 10/26, SO THEY CAN HAVE THESE RESULTS ON FILE    Testing/Procedures:  Your physician has requested that you have a cardiac catheterization. Cardiac catheterization is used to diagnose and/or treat various heart conditions. Doctors may recommend this procedure for a number of different reasons. The most common  reason is to evaluate chest pain. Chest pain can be a symptom of coronary artery disease (CAD), and cardiac catheterization can show whether plaque is narrowing or blocking your heart's arteries. This procedure is also used to evaluate the valves, as well as measure the blood flow and oxygen levels in different parts of your heart. For further information please visit [email or web address] Please follow instruction sheet, as given. YOUR CARDIAC CATH IS SCHEDULED FOR TOMORROW 09/13/17 AT 1:30 PM FOR DR BENSIMHON TO DO.  PLEASE ARRIVE AT THE [place] OF [place] AT 1:30 PM.    Your physician has recommended that you wear a 48 HOUR  holter monitor. Holter monitors are medical devices that record the heart's electrical activity. Doctors most often use these monitors to diagnose arrhythmias. Arrhythmias are problems with the speed or rhythm of the heartbeat. The monitor is a small, portable device. You can wear one while you do your normal daily activities. This is usually used to diagnose what is causing palpitations/syncope (passing out).     Follow-Up:  WILL BE ARRANGED BASED ON YOUR CATH RESULTS TOMORROW--WILL BE ARRANGED AT DISCHARGE       If you need a refill on your cardiac medications before your next appointment, please call your pharmacy.      Signed, Tobias Alexander, MD  09/12/2017 4:15 PM    [facility] [address] [place], [place], [place]  [address] Phone: [phone]; Fax: [phone]

## 2017-09-13 NOTE — Progress Notes (Signed)
Right brachial venous sheath removed intact site unremarkable and pressure held x 

## 2017-09-13 NOTE — Interval H&P Note (Signed)
History and Physical Interval Note:  09/13/2017 4:04 PM  Vernon Charles  has presented today for surgery, with the diagnosis of chf  The various methods of treatment have been discussed with the patient and family. After consideration of risks, benefits and other options for treatment, the patient has consented to  Procedure(s): RIGHT/LEFT HEART CATH AND CORONARY ANGIOGRAPHY (N/A) and possible coronary angioplasty as a surgical intervention .  The patient's history has been reviewed, patient examined, no change in status, stable for surgery.  I have reviewed the patient's chart and labs.  Questions were answered to the patient's satisfaction.     Bensimhon, Reuel Boomaniel

## 2017-09-16 ENCOUNTER — Encounter (HOSPITAL_COMMUNITY): Payer: Self-pay | Admitting: Internal Medicine

## 2017-09-16 MED FILL — Lidocaine HCl Local Inj 2%: INTRAMUSCULAR | Qty: 20 | Status: AC

## 2017-09-18 ENCOUNTER — Telehealth: Payer: Self-pay | Admitting: Cardiology

## 2017-09-18 ENCOUNTER — Ambulatory Visit: Payer: Self-pay | Admitting: Cardiology

## 2017-09-18 DIAGNOSIS — R0789 Other chest pain: Secondary | ICD-10-CM

## 2017-09-18 DIAGNOSIS — R42 Dizziness and giddiness: Secondary | ICD-10-CM

## 2017-09-18 NOTE — Telephone Encounter (Signed)
Patient's echo is scheduled for 11/8 on the same day as his monitor appointment

## 2017-09-18 NOTE — Telephone Encounter (Signed)
I have talked to the patient and apologized.  Please order an echocardiogram here, he agrees to that.  Thank you,  Aris LotKatarina   Order for echo placed and [facility]

## 2017-09-18 NOTE — Telephone Encounter (Signed)
Left message for patient to call back today if possible I do not see that patient has an appointment at our office tomorrow and we do not offer walk-in appointments

## 2017-09-18 NOTE — Telephone Encounter (Signed)
Vernon Charles is returning your call

## 2017-09-18 NOTE — Telephone Encounter (Addendum)
Spoke with patient who states he has questions about the paperwork he received from our office after his ov with Dr. Delton SeeNelson on 10/25. He states he was referred to our office due to an abnormal EKG. He states he was told during his ov with Dr. Delton SeeNelson that he was found to have a very low EF of 15%. He states he was referred for cardiac cath on 10/26 at [facility]. He states cardiac cath showed normal heart function and no blockages. He states that today he was reviewing all of his paperwork and just realized that their is a different patient's name on the paperwork. He reports it is on the echocardiogram report that is stapled to his office visit paperwork. He states he believes these papers came from our office. He states he was initially treated for a fall down the stairs and later experienced CP and dizziness during the night and [facility]. He states he has not received treatment at any other facility other than his PCP and the physician for workman's comp. He states he has never had an echocardiogram. He states he is thankful to know that he does not have any CAD. He states he called because he was confused and felt like we need to be aware that there is another patient's report on his paperwork. He states he has felt better since starting losartan.  He states he feels like there is a butterfly in his chest. He has an appointment to get a 48 hour monitor next week. I advised that I will need to review with management and will be back in touch later.  He verbalized understanding and thanked me for the call.

## 2017-09-18 NOTE — Telephone Encounter (Signed)
Pt called and has some concerns about his medical rec.   Pt will bring his AVS 10/25 To the office 11/1.

## 2017-09-19 NOTE — Telephone Encounter (Signed)
Called patient per request of clinic supervisor, Dennis BastKelly Lanier, RN and asked him to bring the paper that he has with the other patient's information on it. He states he will bring it in with him next week. I thanked him for his time.

## 2017-09-23 NOTE — Progress Notes (Addendum)
The patient underwent a left and right cardiac catheterization with normal LVEF and normal coronary arteries. It was discovered that in his paperwork prepared by chart prep the echocardiogram report belonged to a different patient. However based on patient's significant symptoms of chest pains, DOE and symptomatic palpitations the management wouldn't change. I have called the patient on Thursday November 1 and apologized for misinformation. The patient understands and is overall happy for care and good results. We will obtain echocardiogram and 48 hour Holter monitor as previously planned for the above stated symptoms. The patient agrees.  Tobias AlexanderKatarina Milik Gilreath, MD 09/23/2017   During the phone conversation the patient was instructed to discontinue taking spironolactone, losartan and aspirin. We will remove these medications from his medications.  Tobias AlexanderKatarina Markeya Mincy, MD 09/24/2017

## 2017-09-24 ENCOUNTER — Telehealth: Payer: Self-pay | Admitting: *Deleted

## 2017-09-24 NOTE — Telephone Encounter (Signed)
Spoke with the pt to confirm that he is not taking discontinued meds, per Dr Delton SeeNelson.  Pt endorsed he stopped taking ASA, Spironolactone, and Losartan, as ordered for discontinuation by Dr Delton SeeNelson. D/C'ed these from the pts med list.  Pt verbalized understanding and agreed with plan.  Pt gracious for the follow-up call.

## 2017-09-24 NOTE — Telephone Encounter (Signed)
-----   Message from Lars MassonKatarina H Nelson, MD sent at 09/24/2017  2:30 PM EST ----- During the phone conversation the patient was instructed to discontinue taking spironolactone, losartan and aspirin. We will remove these medications from his medications.  Could you please correct his meds and call him just to make sure he is not taking these?   Thank you,  KN

## 2017-09-26 ENCOUNTER — Other Ambulatory Visit: Payer: Self-pay

## 2017-09-26 ENCOUNTER — Ambulatory Visit (INDEPENDENT_AMBULATORY_CARE_PROVIDER_SITE_OTHER): Payer: 59

## 2017-09-26 ENCOUNTER — Ambulatory Visit (HOSPITAL_COMMUNITY): Payer: Self-pay | Attending: Cardiology

## 2017-09-26 DIAGNOSIS — R002 Palpitations: Secondary | ICD-10-CM | POA: Diagnosis not present

## 2017-09-26 DIAGNOSIS — R0789 Other chest pain: Secondary | ICD-10-CM | POA: Insufficient documentation

## 2017-09-26 DIAGNOSIS — R42 Dizziness and giddiness: Secondary | ICD-10-CM | POA: Insufficient documentation

## 2017-09-26 DIAGNOSIS — R0600 Dyspnea, unspecified: Secondary | ICD-10-CM | POA: Insufficient documentation

## 2017-09-26 DIAGNOSIS — M545 Low back pain: Secondary | ICD-10-CM | POA: Insufficient documentation

## 2017-10-14 ENCOUNTER — Telehealth: Payer: Self-pay | Admitting: Cardiology

## 2017-10-14 MED ORDER — METOPROLOL TARTRATE 25 MG PO TABS: 12.5000 mg | ORAL_TABLET | Freq: Two times a day (BID) | ORAL | 3 refills | Status: DC

## 2017-10-14 NOTE — Telephone Encounter (Signed)
We can try metoprolol 12.5 mg po BID, please have him call us in a week or towo to see how he feels with it and if it helped.

## 2017-10-14 NOTE — Telephone Encounter (Signed)
I spoke with pt and gave him information from Dr. Delton SeeNelson. Will send prescription to [facility] on [address].

## 2017-10-14 NOTE — Telephone Encounter (Signed)
I spoke with pt. He continues to feel palpitations.  Sometimes during the day but they are worse at night when he is lying down to go to sleep. Is trying to watch diet. Was recently on a cruise and admits he ate poorly. States he had palpitations while on cruise but also has been having them at home.  He would like to start a medication for palpitations if Dr. Delton SeeNelson feels it would be helpful.

## 2017-10-14 NOTE — Telephone Encounter (Signed)
Left message to call back  

## 2017-10-14 NOTE — Telephone Encounter (Signed)
Mr. Vernon Charles is calling to let you know that he would like to go on a Beta Blocker . Please call

## 2017-10-14 NOTE — Telephone Encounter (Signed)
F/u Message ° °Pt returning RN call. Please call back to discuss  °

## 2017-10-14 NOTE — Addendum Note (Signed)
Addended by: Dossie ArbourADELMAN, Jaclynne Baldo L on: 10/14/2017 01:31 PM   Modules accepted: Orders

## 2017-11-06 ENCOUNTER — Telehealth: Payer: Self-pay | Admitting: Cardiology

## 2017-11-06 MED ORDER — CARVEDILOL 3.125 MG PO TABS: 3.1250 mg | ORAL_TABLET | Freq: Two times a day (BID) | ORAL | 2 refills | Status: DC

## 2017-11-06 NOTE — Telephone Encounter (Signed)
Follow Up ° ° ° °Returning call from earlier. Please call. °

## 2017-11-06 NOTE — Telephone Encounter (Signed)
Spoke with the pt and informed that per Dr Delton SeeNelson and Tresa EndoKelly PharmD, we will stop metoprolol tartrate now and start him on carvedilol 3.125 mg po bid instead.  Advised the pt to call back if this doesn't improve his heartburn.  Confirmed the pharmacy of choice with the pt.  Pt verbalized understanding and agrees with this plan. Update metoprolol in pts allergies, as an intolerance.

## 2017-11-06 NOTE — Telephone Encounter (Signed)
°  New Prob  States new medication started on is working. However, he is experiencing heartburn. Requesting recommendations for improvement. Please call.

## 2017-11-06 NOTE — Telephone Encounter (Signed)
Attempted to call the pt back to endorse both Dr Lindaann SloughNelson's and our Pharmacist recommendations, based on pts c/o heartburn with metoprolol tartrate use.  Pt did not answer and VM is full.  Per Dr. Delton SeeNelson and our Pharmacist Tresa EndoKelly, there is a 1% chance and supporting studies that do say metoprolol can cause heartburn.  Per Tresa EndoKelly PharmD, its a very low chance, at 1%.  Per Tresa EndoKelly and Dr. Delton SeeNelson, they recommend that we can switch the pt over to Carvedilol 3.125 mg po BID, for the risk of developing heartburn with this med, is less than with metoprolol use.   Will continue to follow-up with the pt.

## 2017-11-13 ENCOUNTER — Telehealth: Payer: Self-pay | Admitting: Cardiology

## 2017-11-13 NOTE — Telephone Encounter (Signed)
New Message  Pt c/o medication issue:  1. Name of Medication: Carvedilol   2. How are you currently taking this medication (dosage and times per day)? 3.125mg    3. Are you having a reaction (difficulty breathing--STAT)? no  4. What is your medication issue? Per pt states mediation sto heart burn, but would like to discuss getting a high dosage. Please call back to discuss

## 2017-11-13 NOTE — Telephone Encounter (Signed)
Patient called back. Patient stated he no longer has heart burn, but has some fluttering through out the day. Patient was thinking he might need an increase in his coreg. Patient's HR is in the 90's. Informed patient that his message would be sent to Dr. Delton SeeNelson for further advisement, and patient is aware that he will hear back on Friday when Dr. Delton SeeNelson is back. Patient is fine with an increase or if he has to stay on same dose, either way is fine. Will forward to Dr. Delton SeeNelson and her nurse.

## 2017-11-13 NOTE — Telephone Encounter (Signed)
Left message for patient to call back  

## 2017-11-14 NOTE — Telephone Encounter (Signed)
Please increase his Coreq to 6.25 mg po BID, thank you KN

## 2017-11-15 MED ORDER — CARVEDILOL 6.25 MG PO TABS: 6.2500 mg | ORAL_TABLET | Freq: Two times a day (BID) | ORAL | 2 refills | Status: DC

## 2017-11-15 NOTE — Telephone Encounter (Signed)
Spoke with the pt and informed him that per Dr Delton SeeNelson, we can increase his carvedilol to 6.25 mg po BID.  Informed the pt that I will send in a months supply to his confirmed pharmacy of choice, to make sure this is tolerated appropriately.  Advised the pt to take this with meals and stay plenty hydrated.  Pt verbalized understanding and agrees with this plan.

## 2017-11-20 ENCOUNTER — Telehealth: Payer: Self-pay | Admitting: Cardiology

## 2017-11-20 MED ORDER — CARVEDILOL 12.5 MG PO TABS: 12.5000 mg | ORAL_TABLET | Freq: Two times a day (BID) | ORAL | 3 refills | Status: DC

## 2017-11-20 NOTE — Telephone Encounter (Signed)
Please increase to 12.5 mg po BID

## 2017-11-20 NOTE — Telephone Encounter (Signed)
Notified the pt that per Dr Delton SeeNelson, we can increase this to carvedilol 12.5 mg PO BID.  Confirmed the pharmacy of choice with the pt.  Pt verbalized understanding and agrees with this plan.

## 2017-11-20 NOTE — Telephone Encounter (Signed)
New message  Pt c/o medication issue:  1. Name of Medication: does not know  2. How are you currently taking this medication (dosage and times per day)? 6mg  twice a day  3. Are you having a reaction (difficulty breathing--STAT)? pvc can feel heart fluttering  4. What is your medication issue? Needs to up the dosage of meds, heart fluttering

## 2017-11-20 NOTE — Telephone Encounter (Signed)
Pt calling to inform Dr Delton SeeNelson that he feels like his PVCs are improving, and he's tolerating his carvedilol well, but he feels like he could take an increase in dose.  Pt states "I feel like we're almost there with dosing of this med." Pt had to switch from metoprolol to carvedilol, for indigestion issues. Pt states metoprolol worked well for his PVCs, but caused him extreme digestion and heartburn issues. Pt states that "I'm wanting the equivalent dose of metoprolol with carvedilol." Pt states he's eliminated all caffeine, decreased salt, and avoided chocolate. Pt states he is drinking 64 ounces of water a day. Pt states he exercises regularly now, and he states "this feels like it improves my symptoms." Pt states he is compliant with taking this med as prescribed.  Pt states its working well in the mornings and and in the evening at dinner time, but in midday, he can feel it wearing off and his palpitations start back.  Pt wanted to know if Dr Delton SeeNelson would advise on increasing his carvedilol to the next dose.  Informed the pt that I will route this message to Dr Delton SeeNelson to review and advise on, and follow-up with the pt shortly thereafter.  Pt verbalized understanding and agrees with this plan.

## 2018-01-31 ENCOUNTER — Telehealth: Payer: Self-pay | Admitting: Cardiology

## 2018-01-31 MED ORDER — CARVEDILOL 12.5 MG PO TABS: 18.7500 mg | ORAL_TABLET | Freq: Two times a day (BID) | ORAL | 1 refills | Status: DC

## 2018-01-31 NOTE — Telephone Encounter (Signed)
Dr. Delton SeeNelson, clarified with him the frequency and duration of his palpitations. He said they've been intermittent everyday for the last week. Pt states that when this occurs, they last for a couple of seconds then go away.  He said they're so hard to count how many times this is happening, for they're so sporadic in nature. Do you still want to increase to 18.75 mg po bid? Thanks so much!

## 2018-01-31 NOTE — Telephone Encounter (Signed)
Spoke with the pt and informed him that Dr Delton SeeNelson recommends that we increase his carvedilol to 18.75 mg po bid, and call us back when he returns, and if his palpitations continue, then we could order for him to have a 48 hour holter monitor placed.  Confirmed the pharmacy of choice with the pt. Advised the pt to take 1 1/2 tabs of his 12.5 mg tabs po bid. Pt verbalized understanding and agrees with this plan.

## 2018-01-31 NOTE — Telephone Encounter (Signed)
Patient c/o Palpitations:  High priority if patient c/o lightheadedness, shortness of breath, or chest pain  1) How long have you had palpitations/irregular HR/ Afib? Are you having the symptoms now? yes   2) Are you currently experiencing lightheadedness, SOB or CP? No   3) Do you have a history of afib (atrial fibrillation) or irregular heart rhythm? yes  4) Have you checked your BP or HR? (document readings if available): n/a  5) Are you experiencing any other symptoms? No

## 2018-01-31 NOTE — Telephone Encounter (Signed)
Pt is calling to let Dr Delton SeeNelson know that over the last week and half, he has been experiencing intermittent palpitations.  Pt states that he has not changed his diet, he is avoiding all caffeine and drinking only water, and he is walking/exercising daily. Pt states that sometimes when the palpitations occur, he feels a slight discomfort in his chest, and sometimes dizziness.  Pt states he has NO SOB, DOE, pre-syncopal or syncopal episodes with these episodes. Pt states his baseline resting HR runs usually in the low 60's, but within the last week, his resting HR has been running 78-80's.  Pt states that he thinks his body has become acclimated to his dose of coreg 12.5 mg po bid. Pt states he is taking this med just as prescribed. Pt is concerned, for he will be leaving out of the state to [place] on Monday, and he will be gone for [place] [employer] [place].  Pt states his stress level has remained the same, with nothing tragic going on at this time.  Pt would like for Dr Delton SeeNelson to advise on what he should do? Should his carvedilol be increased or go to a different regimen.  Pt states he tried metoprolol and had horrible GI issues with this med.  Pt states he tolerates coreg well. Informed the pt that I will route this message to Dr Delton SeeNelson to review and advise on, and I will follow-up with him shortly thereafter. Advised the pt to continue his current regimen, until otherwise advised.  Pt verbalized understanding and agrees with this plan.

## 2018-01-31 NOTE — Telephone Encounter (Signed)
Does he have palpitations every day and how long do they last? He can increase carvedilol to 18.75 mg po BID, he can still go to [place], if his palpitations continue we will place a 48 hour Holter monitor after he comes back.

## 2018-02-02 ENCOUNTER — Other Ambulatory Visit: Payer: Self-pay | Admitting: Cardiology

## 2018-02-05 ENCOUNTER — Telehealth: Payer: Self-pay | Admitting: Cardiology

## 2018-02-05 DIAGNOSIS — R002 Palpitations: Secondary | ICD-10-CM

## 2018-02-05 NOTE — Telephone Encounter (Signed)
Vernon Charles is calling because the medication change made him feel better , still have some of the symptoms , just not as aggravating as it was . Please call if you have any questions .   Thanks

## 2018-02-05 NOTE — Telephone Encounter (Signed)
Will forward to Dr Delton SeeNelson and her nurse for their knowledge.  Carvedilol was increased to 12.5 mg 1 and 1/2 tablets BID 5 days ago.

## 2018-02-06 DIAGNOSIS — R002 Palpitations: Secondary | ICD-10-CM | POA: Insufficient documentation

## 2018-02-06 NOTE — Telephone Encounter (Signed)
Left a message for the pt to call back to endorse recommendations per Dr Nelson. 

## 2018-02-06 NOTE — Telephone Encounter (Signed)
Follow up   Patient is returning call. He says yes on the harness and you want to leave medication as is. Please call to discuss.

## 2018-02-06 NOTE — Telephone Encounter (Signed)
Left a detailed message on the pts confirmed VM that he should continue his current dose of carvedilol 18.75 mg po bid and I will place the order for the 48 hour holter monitor in the system and [facility] a message to call him back to have this appt arranged. Advised the pt to call back tomorrow with any questions and concerns regarding message left.

## 2018-02-06 NOTE — Telephone Encounter (Signed)
Dr. Delton SeeNelson, do you want to increase his carvedilol more, or keep him at new increase of 18.75 mg po bid? Also would you like for him to have a 48 hour holter monitor placed, or an event?  (please refer to 3/15 telephone encounter where you mentioned possibly placing a 48 hr monitor on him). Thanks!

## 2018-02-06 NOTE — Telephone Encounter (Signed)
I would continue the current dose and chcek 48 hour monitor.

## 2018-02-07 NOTE — Telephone Encounter (Signed)
Pts 48 hour holter is scheduled for 02-14-18.  Pt made aware of [facility] date and [facility].

## 2018-02-14 ENCOUNTER — Ambulatory Visit (INDEPENDENT_AMBULATORY_CARE_PROVIDER_SITE_OTHER): Payer: 59

## 2018-02-14 DIAGNOSIS — R002 Palpitations: Secondary | ICD-10-CM | POA: Diagnosis not present

## 2018-02-25 DIAGNOSIS — I493 Ventricular premature depolarization: Secondary | ICD-10-CM | POA: Diagnosis not present

## 2018-02-25 DIAGNOSIS — E559 Vitamin D deficiency, unspecified: Secondary | ICD-10-CM | POA: Diagnosis not present

## 2018-02-25 DIAGNOSIS — Z0000 Encounter for general adult medical examination without abnormal findings: Secondary | ICD-10-CM | POA: Diagnosis not present

## 2018-02-25 DIAGNOSIS — M722 Plantar fascial fibromatosis: Secondary | ICD-10-CM | POA: Diagnosis not present

## 2018-03-31 ENCOUNTER — Other Ambulatory Visit: Payer: Self-pay | Admitting: Cardiology

## 2018-03-31 ENCOUNTER — Telehealth: Payer: Self-pay | Admitting: Cardiology

## 2018-03-31 MED ORDER — DILTIAZEM HCL ER COATED BEADS 120 MG PO CP24: 120.0000 mg | ORAL_CAPSULE | Freq: Every day | ORAL | 1 refills | Status: DC

## 2018-03-31 NOTE — Telephone Encounter (Signed)
Dr. Delton See, we did a 48 hour holter monitor on him at the end of March.  Do you still want to order another one? His 3/29 monitor showed sinus brady to sinus tach, infrequent PACs and PVCs, with no runs.  Please advise.

## 2018-03-31 NOTE — Telephone Encounter (Signed)
Spoke with the pt and informed him that per Dr Delton See, we will discontinue his carvedilol and start him on Cardizem CD 120 mg po daily instead.  Informed the pt that we will send in a 30 day supply to make sure he tolerates this appropriately or not.  Confirmed the pharmacy with the pt.  Pt verbalized understanding and agrees with this plan.

## 2018-03-31 NOTE — Telephone Encounter (Signed)
Ok, that's true, lets try cardizem CD 120 mg po daily and see if that makes a difference for him

## 2018-03-31 NOTE — Telephone Encounter (Signed)
New message  Patient states his carvedilol (COREG) 12.5 MG tablet should be increased.  Patient c/o Palpitations:  High priority if patient c/o lightheadedness, shortness of breath, or chest pain  1) How long have you had palpitations/irregular HR/ Afib? Are you having the symptoms now? 2 weeks, not a this time  2) Are you currently experiencing lightheadedness, SOB or CP? NO  3) Do you have a history of afib (atrial fibrillation) or irregular heart rhythm? YES  4) Have you checked your BP or HR? (document readings if available): NO  5) Are you experiencing any other symptoms? NO

## 2018-03-31 NOTE — Telephone Encounter (Signed)
woul dhe be willing to get 48 holter monitor and see what those palpitations are? We can also try cardizem instead, but not yet until we do the Holter monitor

## 2018-03-31 NOTE — Telephone Encounter (Signed)
Pt is calling in to let Dr Delton See know that over the past 3 weeks, towards the end of the day and when he wakes up, he is starting to have breakthrough palpitations again.  Pt states sometimes it causes him some discomfort in his chest and mild dizziness.  Pt states he takes his prescribed carvedilol 18.75 mg po bid, and this eases off.  Pt states he does NOT get SOB, no DOE, pre-syncopal or syncopal episodes.  Pt states that he thinks he's become "immune to the dose increase of his carvedilol." Pt is wanting to know if this should be increased or not. Pt is unable to tolerate metoprolol, due to causing extreme GI upset and extreme GERD. Pt states he has not changed his diet around, he only drinks water, and exercises daily.  Pt would like for Dr Delton See to advise on what he should do for this issue.  Informed the pt that I will route this concern to Dr Delton See to review and advise on, and follow-up with the pt shortly thereafter.  Pt verbalized understanding and agrees with this plan.

## 2018-04-15 ENCOUNTER — Encounter: Payer: Self-pay | Admitting: Cardiology

## 2018-04-28 ENCOUNTER — Encounter: Payer: Self-pay | Admitting: Cardiology

## 2018-04-28 MED ORDER — DILTIAZEM HCL ER COATED BEADS 120 MG PO CP24: ORAL_CAPSULE | ORAL | 2 refills | Status: DC

## 2018-04-28 NOTE — Telephone Encounter (Signed)
Refill request for 90 day supply sent to the pts confirmed pharmacy, as requested.

## 2018-06-22 ENCOUNTER — Encounter: Payer: Self-pay | Admitting: Cardiology

## 2018-06-23 MED ORDER — DILTIAZEM HCL ER COATED BEADS 180 MG PO CP24: 180.0000 mg | ORAL_CAPSULE | Freq: Every day | ORAL | 3 refills | Status: DC

## 2018-06-26 ENCOUNTER — Emergency Department (HOSPITAL_COMMUNITY)
Admission: EM | Admit: 2018-06-26 | Discharge: 2018-06-27 | Disposition: A | Discharge status: Home / Self Care | Payer: 59 | Attending: Emergency Medicine | Admitting: Emergency Medicine

## 2018-06-26 ENCOUNTER — Emergency Department (HOSPITAL_COMMUNITY): Payer: 59

## 2018-06-26 ENCOUNTER — Encounter (HOSPITAL_COMMUNITY): Payer: Self-pay | Admitting: Emergency Medicine

## 2018-06-26 DIAGNOSIS — Z79899 Other long term (current) drug therapy: Secondary | ICD-10-CM | POA: Insufficient documentation

## 2018-06-26 DIAGNOSIS — R61 Generalized hyperhidrosis: Secondary | ICD-10-CM | POA: Diagnosis not present

## 2018-06-26 DIAGNOSIS — R42 Dizziness and giddiness: Secondary | ICD-10-CM | POA: Diagnosis not present

## 2018-06-26 DIAGNOSIS — R079 Chest pain, unspecified: Secondary | ICD-10-CM | POA: Diagnosis not present

## 2018-06-26 LAB — BASIC METABOLIC PANEL
ANION GAP: 11 (ref 5–15)
BUN: 23 mg/dL — ABNORMAL HIGH (ref 6–20)
CALCIUM: 9.4 mg/dL (ref 8.9–10.3)
CO2: 24 mmol/L (ref 22–32)
Chloride: 102 mmol/L (ref 98–111)
Creatinine, Ser: 1.56 mg/dL — ABNORMAL HIGH (ref 0.61–1.24)
GFR calc non Af Amer: 51 mL/min — ABNORMAL LOW (ref >60)
GFR, EST AFRICAN AMERICAN: 59 mL/min — AB (ref >60)
Glucose, Bld: 104 mg/dL — ABNORMAL HIGH (ref 70–99)
Potassium: 3.9 mmol/L (ref 3.5–5.1)
SODIUM: 137 mmol/L (ref 135–145)

## 2018-06-26 LAB — CBC
HCT: 44 % (ref 39.0–52.0)
HEMOGLOBIN: 14.7 g/dL (ref 13.0–17.0)
MCH: 30.2 pg (ref 26.0–34.0)
MCHC: 33.4 g/dL (ref 30.0–36.0)
MCV: 90.3 fL (ref 78.0–100.0)
Platelets: 203 10*3/uL (ref 150–400)
RBC: 4.87 MIL/uL (ref 4.22–5.81)
RDW: 12.1 % (ref 11.5–15.5)
WBC: 7.3 10*3/uL (ref 4.0–10.5)

## 2018-06-26 LAB — I-STAT TROPONIN, ED: TROPONIN I, POC: 0 ng/mL (ref 0.00–0.08)

## 2018-06-26 NOTE — ED Provider Notes (Signed)
[facility] EMERGENCY DEPARTMENT Provider Note   CSN: [id number] Arrival date & time: 06/26/18  1547     History   Chief Complaint Chief Complaint  Patient presents with  . Chest Pain    HPI Vernon Charles is a 47 y.o. male.  Patient presents to the emergency department with a chief complaint of chest pain.  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  He is on diltiazem, prescribed by his cardiologist.  He states that over the past few days he has had a few episodes of chest pain/tightness with some diaphoresis.  He reports also that his symptoms are associated with exertion.  He denies any fever, chills, cough.  Denies any nausea, vomiting, diarrhea.  He has not taken anything for his symptoms.  He denies any other associated symptoms.  The history is provided by the patient. No language interpreter was used.    Past Medical History:  Diagnosis Date  . Allergic   . Chest pain   . Diarrheal disease 2015  . Lower back pain   . Pain in the abdomen 01/2015  . Palpitations   . Panic attack   . Plantar fasciitis, bilateral   . PTSD (post-traumatic stress disorder)   . Rupture of right plantaris tendon 2015  . Shingles   . TBI (traumatic brain injury) [facility]) 2006    Patient Active Problem List   Diagnosis Date Noted  . Palpitations 02/06/2018  . Plantar fasciitis of right foot 04/14/2015    Past Surgical History:  Procedure Laterality Date  . ANKLE SURGERY     left 1989  . NOSE SURGERY  1985  . plantar facsciotomy  07/2016  . RIGHT/LEFT HEART CATH AND CORONARY ANGIOGRAPHY N/A 09/13/2017   Procedure: RIGHT/LEFT HEART CATH AND CORONARY ANGIOGRAPHY;  Surgeon: Dolores Patty, MD;  Location: [facility] INVASIVE CV LAB;  Service: Cardiovascular;  Laterality: N/A;  . SHOULDER SURGERY     right shoulder- 2 surgery 1985 and 2011        Home Medications    Prior to Admission medications   Medication Sig Start Date End Date Taking? Authorizing Provider    cetirizine (ZYRTEC) 10 MG tablet Take 10 mg by mouth daily as needed for allergies.   Yes [provider]  diltiazem (CARDIZEM CD) 180 MG 24 hr capsule Take 1 capsule (180 mg total) by mouth daily. 06/23/18 09/21/18 Yes Lars Masson, MD  fluticasone (FLONASE) 50 MCG/ACT nasal spray Place 1 spray into both nostrils daily as needed for allergies or rhinitis.   Yes [provider]  ibuprofen (ADVIL,MOTRIN) 200 MG tablet Take 600-800 mg by mouth every 8 (eight) hours as needed (for pain.).   Yes [provider]    Family History Family History  Problem Relation Age of Onset  . Lung disease Mother   . Hodgkin's lymphoma Mother   . COPD Mother   . CVA Father   . Heart attack Father   . CAD Father     Social History Social History   Tobacco Use  . Smoking status: Former Smoker    Start date: 09/12/2001  . Smokeless tobacco: Never Used  Substance Use Topics  . Alcohol use: Yes    Comment: socially  . Drug use: No     Allergies   Metoprolol tartrate   Review of Systems Review of Systems  All other systems reviewed and are negative.    Physical Exam Updated Vital Signs BP 123/84   Pulse 73  Temp 97.8 F (36.6 C) (Oral)   Resp 14   Ht 6' (1.829 m)   Wt 88.9 kg   SpO2 97%   BMI 26.58 kg/m   Physical Exam  Constitutional: He is oriented to person, place, and time. He appears well-developed and well-nourished.  HENT:  Head: Normocephalic and atraumatic.  Eyes: Pupils are equal, round, and reactive to light. Conjunctivae and EOM are normal. Right eye exhibits no discharge. Left eye exhibits no discharge. No scleral icterus.  Neck: Normal range of motion. Neck supple. No JVD present.  Cardiovascular: Normal rate, regular rhythm and normal heart sounds. Exam reveals no gallop and no friction rub.  No murmur heard. Pulmonary/Chest: Effort normal and breath sounds normal. No respiratory distress. He has no wheezes. He has no rales. He  exhibits no tenderness.  Abdominal: Soft. He exhibits no distension and no mass. There is no tenderness. There is no rebound and no guarding.  Musculoskeletal: Normal range of motion. He exhibits no edema or tenderness.  Neurological: He is alert and oriented to person, place, and time.  Skin: Skin is warm and dry.  Psychiatric: He has a normal mood and affect. His behavior is normal. Judgment and thought content normal.  Nursing note and vitals reviewed.    ED Treatments / Results  Labs (all labs ordered are listed, but only abnormal results are displayed) Labs Reviewed  BASIC METABOLIC PANEL - Abnormal; Notable for the following components:      Result Value   Glucose, Bld 104 (*)    BUN 23 (*)    Creatinine, Ser 1.56 (*)    GFR calc non Af Amer 51 (*)    GFR calc Af Amer 59 (*)    All other components within normal limits  CBC  I-STAT TROPONIN, ED    EKG EKG Interpretation  Date/Time:  Thursday June 26 2018 15:52:29 EDT Ventricular Rate:  97 PR Interval:  158 QRS Duration: 90 QT Interval:  336 QTC Calculation: 426 R Axis:   89 Text Interpretation:  Normal sinus rhythm Cannot rule out Anterior infarct , age undetermined Abnormal ECG When compared with ECG of 09/06/2017, No significant change was found Confirmed by Dione Booze ([id number]) on 06/27/2018 12:43:21 AM   Radiology Dg Chest 2 View  Result Date: 06/26/2018 CLINICAL DATA:  Chest pain EXAM: CHEST - 2 VIEW COMPARISON:  September 06, 2017 FINDINGS: There is no evident edema or consolidation. The heart size and pulmonary vascularity are normal. No adenopathy. No pneumothorax. No bone lesions. IMPRESSION: No edema or consolidation. Electronically Signed   By: Bretta Bang III M.D.   On: 06/26/2018 16:31    Procedures Procedures (including critical care time)  Medications Ordered in ED Medications - No data to display   Initial Impression / Assessment and Plan / ED Course  I have reviewed the triage vital signs  and the nursing notes.  Pertinent labs & imaging results that were available during my care of the patient were reviewed by me and considered in my medical decision making (see chart for details).     Patient with several episodes of chest pain over the past several days.  Symptoms seem to be exertional in nature.  His initial troponin is negative.  No concerning arrhythmias or ischemic changes on EKG.  Laboratory work-up is otherwise reassuring.  Chest x-ray negative.  Denies any fevers, chills, or productive cough.  Doubt PE, he is not hypoxic or tachycardic.  He did have a heart catheterization less  than a year ago which showed normal coronary arteries, echocardiogram done in November of last year showed an EF of 60 to 65%.  Given these recent studies, feel that delta troponin is sufficient to adequately rule the patient out tonight.  I discussed this plan with Dr. Preston FleetingGlick, who agrees, and recommends delta troponin and discharge if negative with close cardiology follow-up.  Final Clinical Impressions(s) / ED Diagnoses   Final diagnoses:  Nonspecific chest pain    ED Discharge Orders    None       Roxy HorsemanBrowning, Corinthia Helmers, PA-C 06/27/18 0546    Dione BoozeGlick, David, MD 06/27/18 [phone]

## 2018-06-26 NOTE — ED Notes (Signed)
Results reviewed, no changes in acuity at this time 

## 2018-06-26 NOTE — ED Triage Notes (Signed)
Pt states he has been having CP for several weeks. Yesterday they increased his dosage of Diltiazem. Today, pt was at work and started feeling SOB/CP/ dizzy. Pt states he feels awful. Notified his cardiologist and was told to come to ED.

## 2018-06-26 NOTE — ED Provider Notes (Signed)
Patient placed in Quick Look pathway, seen and evaluated   Chief Complaint: Chest pain  HPI:   Chest pain and lightheadedness beginning today while at rest.  States he has had intermittent chest pain and "funny feeling in the chest" for the past couple weeks.  Increased diltiazem dosage 3 days ago.  Symptoms have continued to worsen.  ROS: Chest pain (one)  Physical Exam:   Gen: No distress  Neuro: Awake and Alert  Skin: Warm    Focused Exam:   No diaphoresis.  No pallor.  Pulmonary: No increased work of breathing.  Speaks in full sentences without difficulty.  Lung sounds clear.  No tachypnea.  Cardiac: Normal rate and regular. Peripheral pulses intact.  MSK: No peripheral edema.   Initiation of care has begun. The patient has been counseled on the process, plan, and necessity for staying for the completion/evaluation, and the remainder of the medical screening examination   Concepcion LivingJoy, Anecia Nusbaum C, PA-C 06/26/18 1606    Charlynne PanderYao, David Hsienta, MD 06/27/18 [phone]

## 2018-06-27 LAB — I-STAT TROPONIN, ED: Troponin i, poc: 0.01 ng/mL (ref 0.00–0.08)

## 2018-06-28 ENCOUNTER — Emergency Department (HOSPITAL_COMMUNITY)
Admission: EM | Admit: 2018-06-28 | Discharge: 2018-06-29 | Disposition: A | Discharge status: Home / Self Care | Payer: 59 | Attending: Emergency Medicine | Admitting: Emergency Medicine

## 2018-06-28 ENCOUNTER — Encounter (HOSPITAL_COMMUNITY): Payer: Self-pay | Admitting: Emergency Medicine

## 2018-06-28 ENCOUNTER — Other Ambulatory Visit: Payer: Self-pay

## 2018-06-28 DIAGNOSIS — R079 Chest pain, unspecified: Secondary | ICD-10-CM

## 2018-06-28 DIAGNOSIS — R072 Precordial pain: Secondary | ICD-10-CM | POA: Diagnosis not present

## 2018-06-28 DIAGNOSIS — Z87891 Personal history of nicotine dependence: Secondary | ICD-10-CM | POA: Diagnosis not present

## 2018-06-28 DIAGNOSIS — R0789 Other chest pain: Secondary | ICD-10-CM | POA: Diagnosis not present

## 2018-06-28 DIAGNOSIS — K2900 Acute gastritis without bleeding: Secondary | ICD-10-CM | POA: Diagnosis not present

## 2018-06-28 LAB — BASIC METABOLIC PANEL
Anion gap: 9 (ref 5–15)
BUN: 16 mg/dL (ref 6–20)
CO2: 23 mmol/L (ref 22–32)
CREATININE: 1.32 mg/dL — AB (ref 0.61–1.24)
Calcium: 9.3 mg/dL (ref 8.9–10.3)
Chloride: 104 mmol/L (ref 98–111)
GFR calc non Af Amer: >60 mL/min (ref >60)
Glucose, Bld: 105 mg/dL — ABNORMAL HIGH (ref 70–99)
Potassium: 3.9 mmol/L (ref 3.5–5.1)
SODIUM: 136 mmol/L (ref 135–145)

## 2018-06-28 LAB — CBC
HCT: 42 % (ref 39.0–52.0)
Hemoglobin: 14.1 g/dL (ref 13.0–17.0)
MCH: 30.7 pg (ref 26.0–34.0)
MCHC: 33.6 g/dL (ref 30.0–36.0)
MCV: 91.3 fL (ref 78.0–100.0)
PLATELETS: 203 10*3/uL (ref 150–400)
RBC: 4.6 MIL/uL (ref 4.22–5.81)
RDW: 12.1 % (ref 11.5–15.5)
WBC: 6.6 10*3/uL (ref 4.0–10.5)

## 2018-06-28 LAB — I-STAT TROPONIN, ED: Troponin i, poc: 0 ng/mL (ref 0.00–0.08)

## 2018-06-28 NOTE — ED Notes (Signed)
Taken to xray at this time. 

## 2018-06-28 NOTE — ED Triage Notes (Addendum)
Reports chest pressure for two weeks and having mult pvc's.  Now taking cardizem and reports feeling super tired since increase of dose a week ago.

## 2018-06-29 ENCOUNTER — Emergency Department (HOSPITAL_COMMUNITY): Payer: 59

## 2018-06-29 DIAGNOSIS — R0789 Other chest pain: Secondary | ICD-10-CM | POA: Diagnosis not present

## 2018-06-29 MED ORDER — RANITIDINE HCL 150 MG PO CAPS: 150.0000 mg | ORAL_CAPSULE | Freq: Every day | ORAL | 0 refills | Status: DC

## 2018-06-29 MED ORDER — GI COCKTAIL ~~LOC~~
30.0000 mL | Freq: Once | ORAL | Status: AC
  Administered 2018-06-29: 30 mL via ORAL
  Filled 2018-06-29: qty 30

## 2018-06-29 NOTE — ED Provider Notes (Signed)
[facility] EMERGENCY DEPARTMENT Provider Note  CSN: [id number] Arrival date & time: 06/28/18 2135  Chief Complaint(s) Chest Pain  HPI Vernon Charles is a 47 y.o. male   The history is provided by the patient.  Chest Pain   This is a recurrent problem. Episode onset: 2-3 weeks (this episode) The problem occurs constantly. Progression since onset: fluctuating. The pain is present in the substernal region. Pain severity now: mild to moderate. The quality of the pain is described as vice-like. The pain does not radiate. Episode Length: 2-3 weeks. Exacerbated by: nothing. Associated symptoms include malaise/fatigue. Pertinent negatives include no abdominal pain, no back pain, no claudication, no cough, no diaphoresis, no dizziness, no fever, no headaches, no irregular heartbeat, no leg pain, no palpitations and no shortness of breath.  His past medical history is significant for anxiety/panic attacks and arrhythmia.  Pertinent negatives for past medical history include no CAD, no CHF, no diabetes, no DVT, no hyperlipidemia, no hypertension, no MI, no PE and no strokes.  Procedure history is positive for cardiac catheterization (08/2017, clean).   On Cardizem for freq PVCs. Had it increased last week from 120 mg to 180 mg due to pain. On day 1, pain improved, but worsened the following day. Since then he's been feeling fatigued.  Patient endorses being off caffeine but states that he drinks a lot of decaffeinated instant coffee.  Past Medical History Past Medical History:  Diagnosis Date  . Allergic   . Chest pain   . Diarrheal disease 2015  . Lower back pain   . Pain in the abdomen 01/2015  . Palpitations   . Panic attack   . Plantar fasciitis, bilateral   . PTSD (post-traumatic stress disorder)   . Rupture of right plantaris tendon 2015  . Shingles   . TBI (traumatic brain injury) [facility]) 2006   Patient Active Problem List   Diagnosis Date Noted  .  Palpitations 02/06/2018  . Plantar fasciitis of right foot 04/14/2015   Home Medication(s) Prior to Admission medications   Medication Sig Start Date End Date Taking? Authorizing Provider  cetirizine (ZYRTEC) 10 MG tablet Take 10 mg by mouth daily as needed for allergies.   Yes [provider]  diltiazem (CARDIZEM CD) 180 MG 24 hr capsule Take 1 capsule (180 mg total) by mouth daily. 06/23/18 09/21/18 Yes Lars Masson, MD  fluticasone (FLONASE) 50 MCG/ACT nasal spray Place 1 spray into both nostrils daily as needed for allergies or rhinitis.   Yes [provider]  ibuprofen (ADVIL,MOTRIN) 200 MG tablet Take 600-800 mg by mouth every 8 (eight) hours as needed (for pain.).   Yes [provider]  ranitidine (ZANTAC) 150 MG capsule Take 1 capsule (150 mg total) by mouth daily. 06/29/18   Nira Conn, MD  Past Surgical History Past Surgical History:  Procedure Laterality Date  . ANKLE SURGERY     left 1989  . NOSE SURGERY  1985  . plantar facsciotomy  07/2016  . RIGHT/LEFT HEART CATH AND CORONARY ANGIOGRAPHY N/A 09/13/2017   Procedure: RIGHT/LEFT HEART CATH AND CORONARY ANGIOGRAPHY;  Surgeon: Dolores Patty, MD;  Location: [facility] INVASIVE CV LAB;  Service: Cardiovascular;  Laterality: N/A;  . SHOULDER SURGERY     right shoulder- 2 surgery 1985 and 2011   Family History Family History  Problem Relation Age of Onset  . Lung disease Mother   . Hodgkin's lymphoma Mother   . COPD Mother   . CVA Father   . Heart attack Father   . CAD Father     Social History Social History   Tobacco Use  . Smoking status: Former Smoker    Start date: 09/12/2001  . Smokeless tobacco: Never Used  Substance Use Topics  . Alcohol use: Yes    Comment: socially  . Drug use: No   Allergies Metoprolol tartrate  Review of  Systems Review of Systems  Constitutional: Positive for malaise/fatigue. Negative for diaphoresis and fever.  Respiratory: Negative for cough and shortness of breath.   Cardiovascular: Positive for chest pain. Negative for palpitations and claudication.  Gastrointestinal: Negative for abdominal pain.  Musculoskeletal: Negative for back pain.  Neurological: Negative for dizziness and headaches.   All other systems are reviewed and are negative for acute change except as noted in the HPI   Physical Exam Vital Signs  I have reviewed the triage vital signs BP (!) 141/86   Pulse 62   Temp 98.5 F (36.9 C) (Oral)   Resp 16   Ht 6' (1.829 m)   Wt 88.9 kg   SpO2 96%   BMI 26.58 kg/m   Physical Exam  Constitutional: He is oriented to person, place, and time. He appears well-developed and well-nourished. No distress.  HENT:  Head: Normocephalic and atraumatic.  Nose: Nose normal.  Eyes: Pupils are equal, round, and reactive to light. Conjunctivae and EOM are normal. Right eye exhibits no discharge. Left eye exhibits no discharge. No scleral icterus.  Neck: Normal range of motion. Neck supple.  Cardiovascular: Normal rate and regular rhythm. Exam reveals no gallop and no friction rub.  No murmur heard. Pulmonary/Chest: Effort normal and breath sounds normal. No stridor. No respiratory distress. He has no rales.  Abdominal: Soft. He exhibits no distension. There is no tenderness.  Musculoskeletal: He exhibits no edema or tenderness.  Neurological: He is alert and oriented to person, place, and time.  Skin: Skin is warm and dry. No rash noted. He is not diaphoretic. No erythema.  Psychiatric: He has a normal mood and affect.  Vitals reviewed.   ED Results and Treatments Labs (all labs ordered are listed, but only abnormal results are displayed) Labs Reviewed  BASIC METABOLIC PANEL - Abnormal; Notable for the following components:      Result Value   Glucose, Bld 105 (*)     Creatinine, Ser 1.32 (*)    All other components within normal limits  CBC  I-STAT TROPONIN, ED  EKG  EKG Interpretation  Date/Time:  Saturday June 28 2018 21:39:19 EDT Ventricular Rate:  82 PR Interval:  186 QRS Duration: 94 QT Interval:  370 QTC Calculation: 432 R Axis:   58 Text Interpretation:  Normal sinus rhythm Normal ECG No significant change since last tracing Confirmed by Drema Pry [phone]) on 06/28/2018 11:38:43 PM      Radiology Dg Chest 2 View  Result Date: 06/29/2018 CLINICAL DATA:  Subacute onset of generalized chest pressure. Tiredness. EXAM: CHEST - 2 VIEW COMPARISON:  Chest radiograph performed 06/26/2018 FINDINGS: The lungs are well-aerated and clear. There is no evidence of focal opacification, pleural effusion or pneumothorax. The heart is normal in size; the mediastinal contour is within normal limits. No acute osseous abnormalities are seen. IMPRESSION: No acute cardiopulmonary process seen. Electronically Signed   By: Roanna Raider M.D.   On: 06/29/2018 00:08   Pertinent labs & imaging results that were available during my care of the patient were reviewed by me and considered in my medical decision making (see chart for details).  Medications Ordered in ED Medications  gi cocktail (Maalox,Lidocaine,Donnatal) (30 mLs Oral Given 06/29/18 0049)                                                                                                                                    Procedures Procedures  (including critical care time)  Medical Decision Making / ED Course I have reviewed the nursing notes for this encounter and the patient's prior records (if available in EHR or on provided paperwork).    Highly atypical chest pain inconsistent with ACS.  EKG without acute ischemic changes or evidence of pericarditis.  Initial troponin  negative.  Given duration of patient's symptoms feel that a single troponin is sufficient to rule out ACS.  Low suspicion for pulmonary embolism and patient is PERC negative.  Presentation not classic for aortic dissection or esophageal perforation.  Chest x-ray without evidence suggestive of pneumonia, pneumothorax, pneumomediastinum.  No abnormal contour of the mediastinum to suggest dissection. No evidence of acute injuries.  Considering GI etiology.  Patient given GI cocktail resulting in complete resolution of patient's symptomatology.  Recommended he discontinue any acidic food intake and start on a daily Zantac.  The patient appears reasonably screened and/or stabilized for discharge and I doubt any other medical condition or other [facility] requiring further screening, evaluation, or treatment in the ED at this time prior to discharge.  The patient is safe for discharge with strict return precautions.   Final Clinical Impression(s) / ED Diagnoses Final diagnoses:  Chest pain  Other acute gastritis, presence of bleeding unspecified    Disposition: Discharge  Condition: Good  I have discussed the results, Dx and Tx plan with the patient who expressed understanding and agree(s) with the plan. Discharge instructions discussed at great length. The patient was given strict return precautions who verbalized understanding of the instructions. No further  questions at time of discharge.    ED Discharge Orders         Ordered    ranitidine (ZANTAC) 150 MG capsule  Daily     06/29/18 0118           Follow Up: Marden NobleGates, Robert, MD [address] [place] [address] [phone]  Schedule an appointment as soon as possible for a visit  As needed     This chart was dictated using voice recognition software.  Despite best efforts to proofread,  errors can occur which can change the documentation meaning.   Nira Connardama, Pedro Eduardo, MD 06/29/18 [phone]

## 2018-07-08 ENCOUNTER — Ambulatory Visit: Payer: 59 | Admitting: Physician Assistant

## 2018-07-08 ENCOUNTER — Encounter: Payer: Self-pay | Admitting: Physician Assistant

## 2018-07-08 VITALS — BP 134/90 | HR 68 | Ht 73.0 in | Wt 198.0 lb

## 2018-07-08 DIAGNOSIS — R079 Chest pain, unspecified: Secondary | ICD-10-CM

## 2018-07-08 DIAGNOSIS — R002 Palpitations: Secondary | ICD-10-CM | POA: Diagnosis not present

## 2018-07-08 MED ORDER — PANTOPRAZOLE SODIUM 40 MG PO TBEC: 40.0000 mg | DELAYED_RELEASE_TABLET | Freq: Every day | ORAL | 3 refills | Status: DC

## 2018-07-08 NOTE — Patient Instructions (Signed)
Medication Instructions:  Stop Zantac  START Protonix 40 mg daily  Follow-Up: Your physician wants you to follow-up in: 12 months with Dr. Delton SeeNelson.  You will receive a reminder letter in the mail two months in advance. If you don't receive a letter, please call our office to schedule the follow-up appointment.  Any Other Special Instructions Will Be Listed Below (If Applicable).     If you need a refill on your cardiac medications before your next appointment, please call your pharmacy.

## 2018-07-08 NOTE — Progress Notes (Signed)
Cardiology Office Note    Date:  07/08/2018   ID:  Vernon Charles, DOB 02-16-1971, MRN [id number]  PCP:  Marden NobleGates, Robert, MD  Cardiologist:  Dr. Delton SeeNelson  Chief Complaint  Patient presents with  . Follow-up    seen for Dr. Delton SeeNelson.     History of Present Illness:  Vernon Charles is a 47 y.o. male with PMH of PTSD, shingles, history of traumatic brain injury, and palpitation.  Patient was previously seen in October 2018 by Dr. Delton SeeNelson.  At that time there was scanned record that indicated low ejection fraction, however later this was discovered to be belonged to another patient and it was scanned under the wrong patient.  Unfortunately the patient did undergo cardiac catheterization on 09/13/2017 which showed normal coronaries, EF 55% by LV gram, no evidence of intracardiac shunting.  Echocardiogram obtained on 09/26/2017 showed EF 55 to 60%, grade 1 DD.  A 48-hour heart monitor obtained in November 2018 did show a few PVCs and PACs but otherwise normal sinus rhythm.  This was later repeated in March 2019 which shows sinus bradycardia to sinus tachycardia, even frequent PACs and PVCs.  He was continued on metoprolol.  Cardizem was later added to help control palpitation symptom.  Patient was recently seen in the ED on 06/26/2018 with chest discomfort, dizziness and palpitation.  Troponin was negative.  EKG does not show any significant changes.  Chest x-ray was negative as well.  He presents today for cardiology office visit.  He continued to have occasional prolonged chest pain.  He also has dizziness as well.  He has not been exercising very much for the past month.  I recommended him to continue on the current diltiazem dose and monitor blood pressure at home.  For some of his age, I would recommend blood pressure mainly in the 110 to low 130s.  If systolic blood pressure persistently goes above 140, I would recommend increasing the diltiazem to 240 mg daily.  I am not entirely sure was causing  his occasional dizziness, however both low blood pressure and high blood pressure can cause such symptoms.  He does not have a significant carotid bruit on physical exam.  Blood pressure has been occasionally running high in the 150s.  As far as the chest discomfort, he denies any alleviating or exacerbating factors such as deep inspiration, body rotational palpation.  Given the reassuring cardiac catheterization in late 2018, I would not recommend any further work-up from cardiology perspective.  I will switch his Zantac to Protonix 40 mg daily as a trial to see if this will improve his symptoms.  He did notice some improvement after starting on Zantac.   Past Medical History:  Diagnosis Date  . Allergic   . Chest pain   . Diarrheal disease 2015  . Lower back pain   . Pain in the abdomen 01/2015  . Palpitations   . Panic attack   . Plantar fasciitis, bilateral   . PTSD (post-traumatic stress disorder)   . Rupture of right plantaris tendon 2015  . Shingles   . TBI (traumatic brain injury) (HCC) 2006    Past Surgical History:  Procedure Laterality Date  . ANKLE SURGERY     left 1989  . NOSE SURGERY  1985  . plantar facsciotomy  07/2016  . RIGHT/LEFT HEART CATH AND CORONARY ANGIOGRAPHY N/A 09/13/2017   Procedure: RIGHT/LEFT HEART CATH AND CORONARY ANGIOGRAPHY;  Surgeon: Dolores PattyBensimhon, Daniel R, MD;  Location: [facility];  Service: Cardiovascular;  Laterality: N/A;  . SHOULDER SURGERY     right shoulder- 2 surgery 1985 and 2011    Current Medications: Outpatient Medications Prior to Visit  Medication Sig Dispense Refill  . cetirizine (ZYRTEC) 10 MG tablet Take 10 mg by mouth daily as needed for allergies.    Marland Kitchen. diltiazem (CARDIZEM CD) 180 MG 24 hr capsule Take 1 capsule (180 mg total) by mouth daily. 90 capsule 3  . fluticasone (FLONASE) 50 MCG/ACT nasal spray Place 1 spray into both nostrils daily as needed for allergies or rhinitis.    Marland Kitchen. ibuprofen (ADVIL,MOTRIN) 200 MG tablet  Take 600-800 mg by mouth every 8 (eight) hours as needed (for pain.).    Marland Kitchen. ranitidine (ZANTAC) 150 MG capsule Take 1 capsule (150 mg total) by mouth daily. 30 capsule 0   No facility-administered medications prior to visit.      Allergies:   Metoprolol tartrate   Social History   Socioeconomic History  . Marital status: Married    Spouse name: Not on file  . Number of children: Not on file  . Years of education: Not on file  . Highest education level: Not on file  Occupational History  . Occupation: Programmer, systemsheriff's office    [employer] [employer]  Social Needs  . Financial resource strain: Not on file  . Food insecurity:    Worry: Not on file    Inability: Not on file  . Transportation needs:    Medical: Not on file    Non-medical: Not on file  Tobacco Use  . Smoking status: Former Smoker    Start date: 09/12/2001  . Smokeless tobacco: Never Used  Substance and Sexual Activity  . Alcohol use: Yes    Comment: socially  . Drug use: No  . Sexual activity: Not on file    Comment: married  Lifestyle  . Physical activity:    Days per week: Not on file    Minutes per session: Not on file  . Stress: Not on file  Relationships  . Social connections:    Talks on phone: Not on file    Gets together: Not on file    Attends religious service: Not on file    Active member of club or organization: Not on file    Attends meetings of clubs or organizations: Not on file    Relationship status: Not on file  Other Topics Concern  . Not on file  Social History Narrative  . Not on file     Family History:  The patient's family history includes CAD in his father; COPD in his mother; CVA in his father; Heart attack in his father; Hodgkin's lymphoma in his mother; Lung disease in his mother.   ROS:   Please see the history of present illness.    ROS All other systems reviewed and are negative.   PHYSICAL EXAM:   VS:  BP 134/90   Pulse 68   Ht 6\' 1"  (1.854 m)   Wt 198 lb (89.8  kg)   BMI 26.12 kg/m    GEN: Well nourished, well developed, in no acute distress  HEENT: normal  Neck: no JVD, carotid bruits, or masses Cardiac: RRR; no murmurs, rubs, or gallops,no edema  Respiratory:  clear to auscultation bilaterally, normal work of breathing GI: soft, nontender, nondistended, + BS MS: no deformity or atrophy  Skin: warm and dry, no rash Neuro:  Alert and Oriented x 3, Strength and sensation are intact Psych: euthymic  mood, full affect  Wt Readings from Last 3 Encounters:  07/08/18 198 lb (89.8 kg)  06/28/18 196 lb (88.9 kg)  06/26/18 196 lb (88.9 kg)      Studies/Labs Reviewed:   EKG:  EKG is not ordered today.  Recent EKG from emergency room has been reviewed, no significant ST-T wave changes, normal sinus rhythm.  Recent Labs: 06/28/2018: BUN 16; Creatinine, Ser 1.32; Hemoglobin 14.1; Platelets 203; Potassium 3.9; Sodium 136   Lipid Panel No results found for: CHOL, TRIG, HDL, CHOLHDL, VLDL, LDLCALC, LDLDIRECT  Additional studies/ records that were reviewed today include:   L&RHC 09/13/2017 Findings:  RA = 3 RV = 22/7 PA = 19/7 (13) PCW = 7 Fick cardiac output/index = 7.7/3.7 PVR = 0.8 WU Ao sat = 94% PA sat = 76%, 75% High SVC sat = 75%  Assessment: 1. Normal coronary arteries 2. EF 55% by v-gram 3. Normal hemodynamics 4. No evidence of intracardiac shunting  Plan/Discussion:  His EF has normalized by v-gram. Coronaries are normal. Will repeat echo. Continue medical therapy.     Echo 09/26/2017 LV EF: 55% -   60% Study Conclusions  - Left ventricle: The cavity size was normal. Wall thickness was   normal. Systolic function was normal. The estimated ejection   fraction was in the range of 55% to 60%. Wall motion was normal;   there were no regional wall motion abnormalities. Doppler   parameters are consistent with abnormal left ventricular   relaxation (grade 1 diastolic dysfunction). - Aortic valve: There was no  stenosis. - Mitral valve: There was no significant regurgitation. - Right ventricle: The cavity size was normal. Systolic function   was normal. - Pulmonary arteries: No complete TR doppler jet so unable to   estimate PA systolic pressure. - Inferior vena cava: The vessel was normal in size. The   respirophasic diameter changes were in the normal range (>= 50%),   consistent with normal central venous pressure.  Impressions:  - Nomral LV size with EF 60-65%. Normal RV size and systolic   function. No significant valvular abnormalities.   Holter 02/14/2018 Study Highlights     Sinus bradycardia to sinus tachycardia.  Infrequent PACs and PVCs.  No runs.   Sinus bradycardia to sinus tachycardia. Infrequent PACs and PVCs. No runs. I would continue the same dose of metoprolol.     ASSESSMENT:    1. Chest pain, unspecified type   2. Palpitation      PLAN:  In order of problems listed above:  1. Chest pain: Unlikely to be cardiac in nature given reassuring study in October 2018.  There is no exertional component to his chest pain.  It is not worsened with deep inspiration, body rotational palpation.  Zantac seems to improve his symptoms slightly, I will switch Zantac to Protonix 40 mg daily as a trial to see if this will help him.  Doubt cardiac chest pain.  Suspicion for DVT and PE very low in this young patient.  2. Palpitation: Currently on diltiazem 180 mg daily.  I would encourage him to increase activity level.  He does notice sometimes his blood pressure peak into the 150s, however as long as most of the time his blood pressure ranges between 110-130s, I would be in favor of continue on the current dose.    Medication Adjustments/Labs and Tests Ordered: Current medicines are reviewed at length with the patient today.  Concerns regarding medicines are outlined above.  Medication changes, Labs and  Tests ordered today are listed in the Patient Instructions  below. Patient Instructions  Medication Instructions:  Stop Zantac  START Protonix 40 mg daily  Follow-Up: Your physician wants you to follow-up in: 12 months with Dr. Delton See.  You will receive a reminder letter in the mail two months in advance. If you don't receive a letter, please call our office to schedule the follow-up appointment.  Any Other Special Instructions Will Be Listed Below (If Applicable).     If you need a refill on your cardiac medications before your next appointment, please call your pharmacy.      Ramond Dial, [place]  07/08/2018 1:03 PM    [facility] [address] [place], [place], [place]  [address] Phone: [phone]; Fax: [phone]

## 2018-07-09 ENCOUNTER — Ambulatory Visit: Payer: Self-pay | Admitting: Cardiology

## 2018-07-23 ENCOUNTER — Other Ambulatory Visit: Payer: Self-pay | Admitting: Physician Assistant

## 2018-07-23 MED ORDER — ESOMEPRAZOLE MAGNESIUM 40 MG PO CPDR: 40.0000 mg | DELAYED_RELEASE_CAPSULE | Freq: Every day | ORAL | 3 refills | Status: DC

## 2018-07-25 ENCOUNTER — Ambulatory Visit: Payer: Self-pay | Admitting: Cardiology

## 2018-08-01 DIAGNOSIS — Z23 Encounter for immunization: Secondary | ICD-10-CM | POA: Diagnosis not present

## 2018-08-01 DIAGNOSIS — N451 Epididymitis: Secondary | ICD-10-CM | POA: Diagnosis not present

## 2018-09-26 DIAGNOSIS — R12 Heartburn: Secondary | ICD-10-CM | POA: Diagnosis not present

## 2018-10-13 DIAGNOSIS — R079 Chest pain, unspecified: Secondary | ICD-10-CM | POA: Diagnosis not present

## 2018-10-14 IMAGING — CR DG CHEST 2V
2 series · 2 of 2 positions shown · non-contrast
Comparison: Chest radiograph performed 06/26/2018

CLINICAL DATA: Subacute onset of generalized chest pressure.
Tiredness.

EXAM:
CHEST - 2 VIEW

[chest pa]
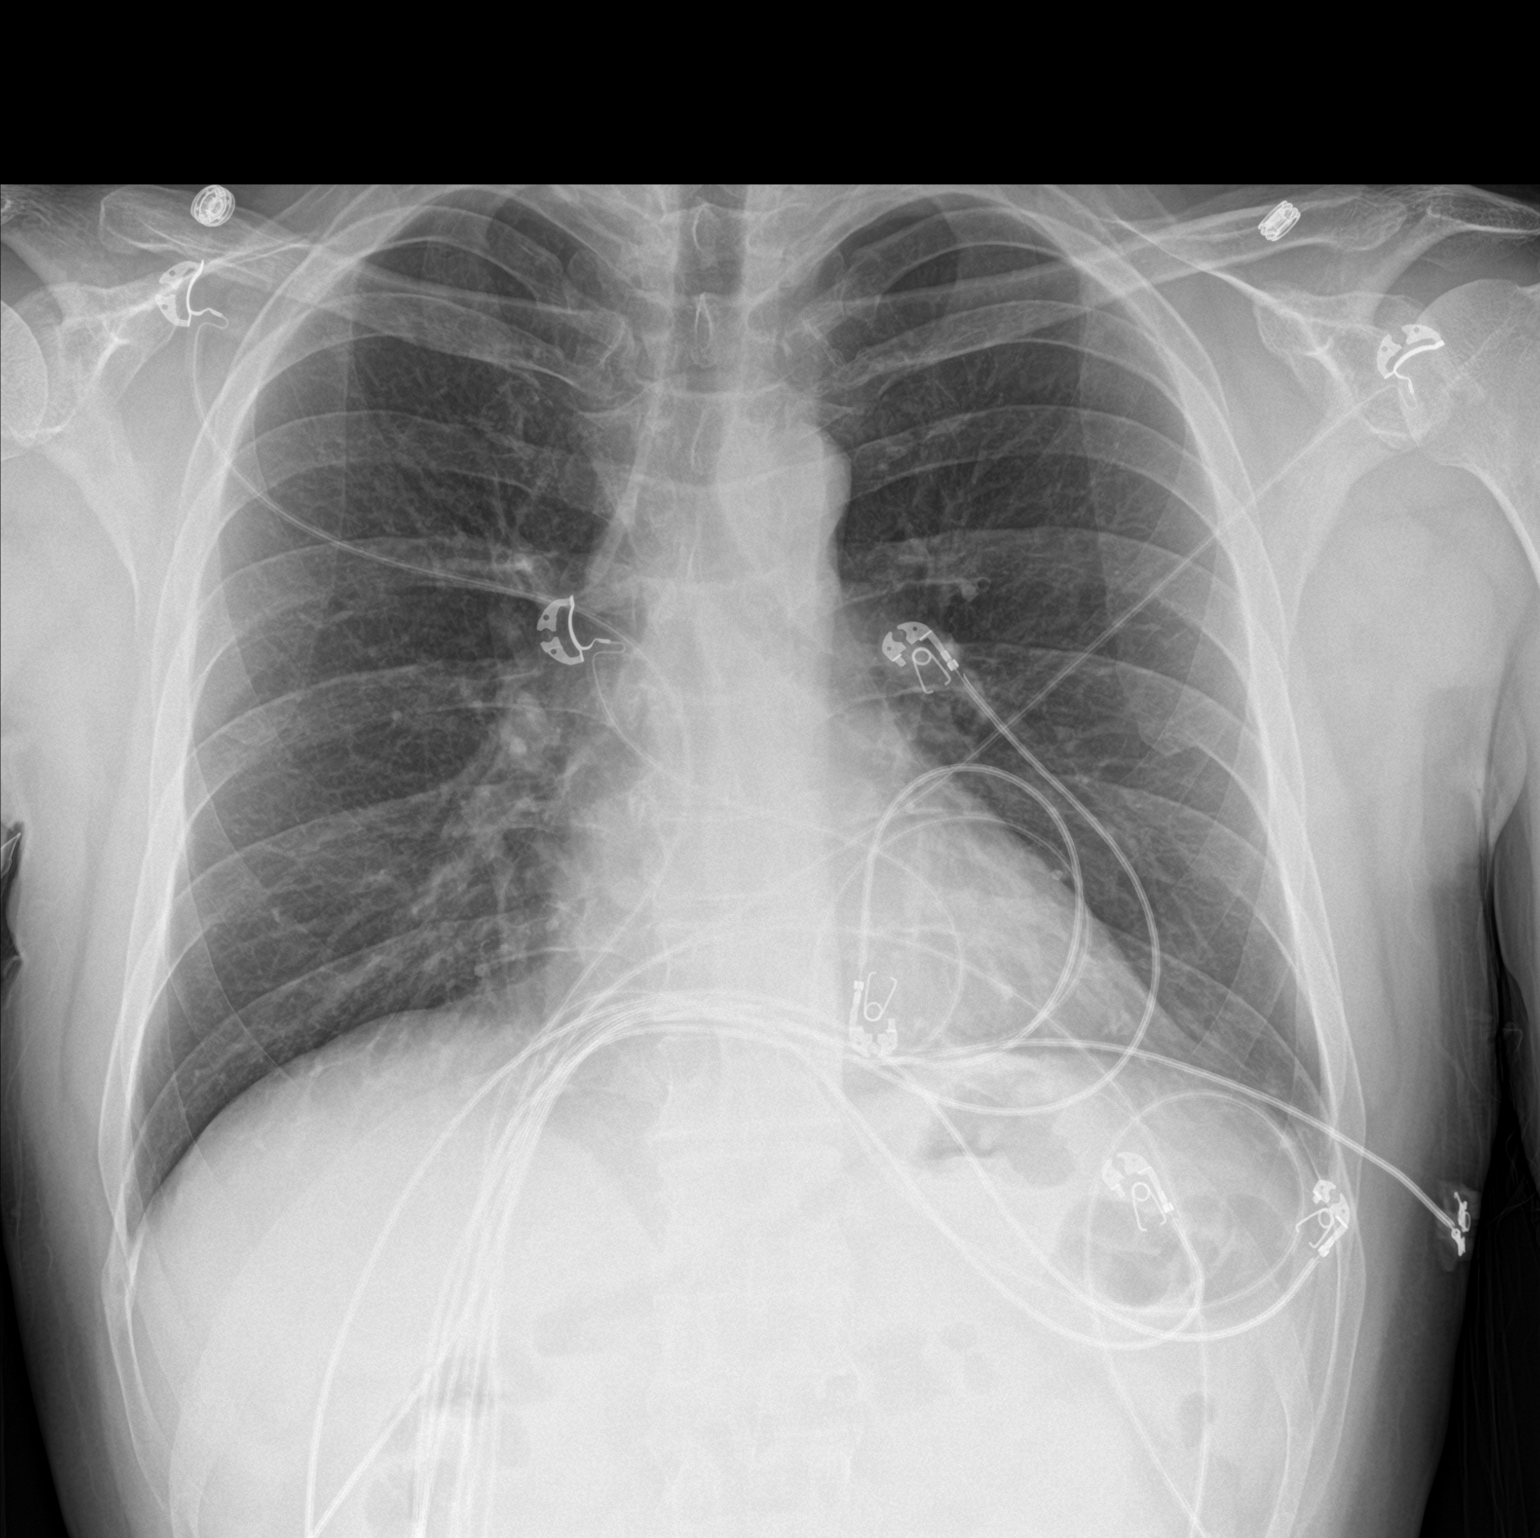

[chest lat]
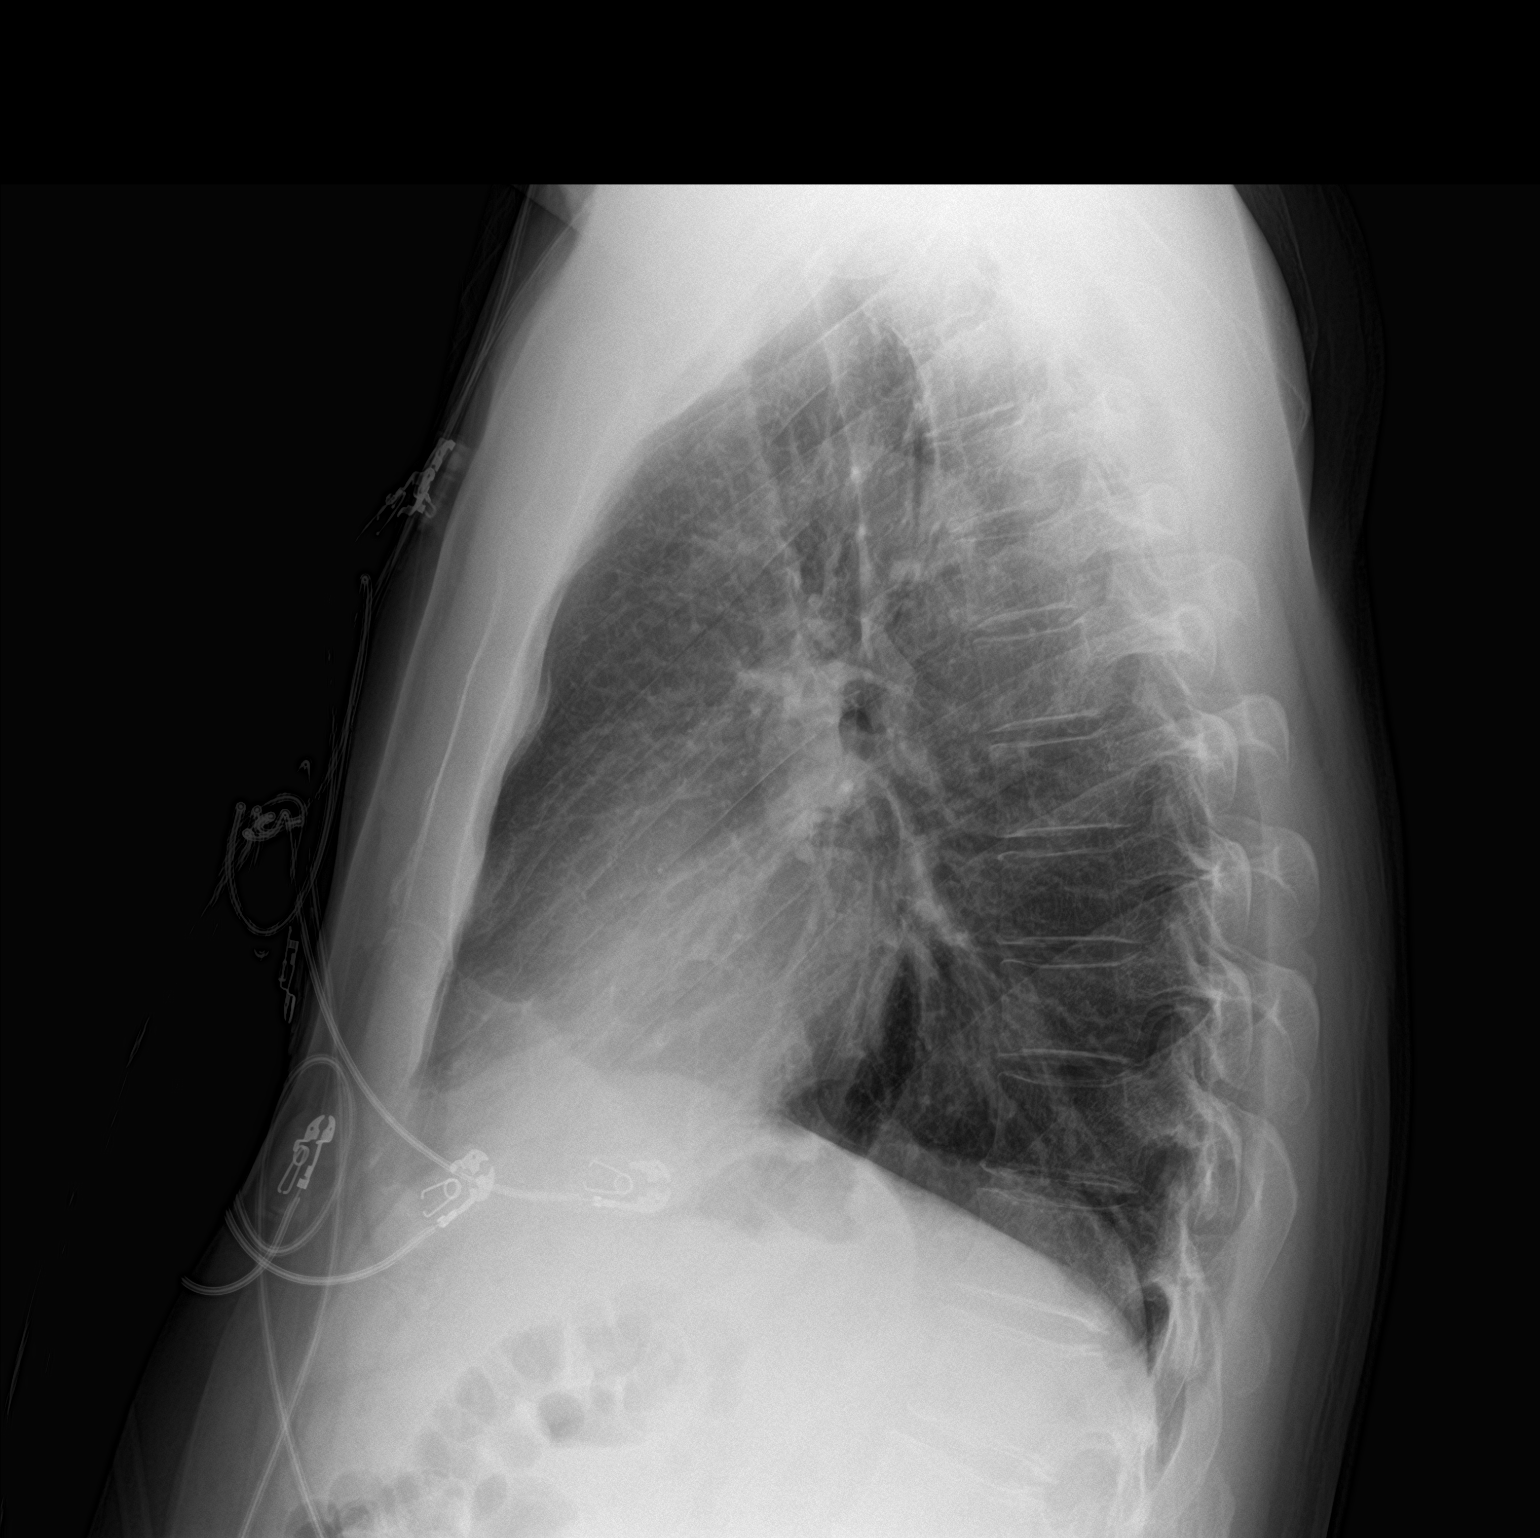

[2 of 2 positions shown; findings below may reference images not displayed]

FINDINGS: The lungs are well-aerated and clear. There is no evidence of focal
opacification, pleural effusion or pneumothorax.

The heart is normal in size; the mediastinal contour is within
normal limits. No acute osseous abnormalities are seen.
IMPRESSION: No acute cardiopulmonary process seen.

## 2018-10-20 DIAGNOSIS — K2930 Chronic superficial gastritis without bleeding: Secondary | ICD-10-CM | POA: Diagnosis not present

## 2018-10-20 DIAGNOSIS — K229 Disease of esophagus, unspecified: Secondary | ICD-10-CM | POA: Diagnosis not present

## 2018-10-20 DIAGNOSIS — R079 Chest pain, unspecified: Secondary | ICD-10-CM | POA: Diagnosis not present

## 2018-10-20 DIAGNOSIS — K219 Gastro-esophageal reflux disease without esophagitis: Secondary | ICD-10-CM | POA: Diagnosis not present

## 2018-10-20 DIAGNOSIS — K228 Other specified diseases of esophagus: Secondary | ICD-10-CM | POA: Diagnosis not present

## 2019-06-30 ENCOUNTER — Ambulatory Visit
Admission: RE | Admit: 2019-06-30 | Discharge: 2019-06-30 | Disposition: A | Discharge status: Home / Self Care | Payer: 59 | Source: Ambulatory Visit | Attending: Internal Medicine | Admitting: Internal Medicine

## 2019-06-30 ENCOUNTER — Other Ambulatory Visit: Payer: Self-pay | Admitting: Internal Medicine

## 2019-06-30 ENCOUNTER — Other Ambulatory Visit: Payer: Self-pay | Admitting: Cardiology

## 2019-06-30 DIAGNOSIS — M79671 Pain in right foot: Secondary | ICD-10-CM

## 2019-09-23 ENCOUNTER — Encounter: Payer: Self-pay | Admitting: Cardiology

## 2019-09-23 ENCOUNTER — Other Ambulatory Visit: Payer: Self-pay

## 2019-09-23 ENCOUNTER — Ambulatory Visit: Payer: 59 | Admitting: Cardiology

## 2019-09-23 VITALS — BP 126/92 | HR 83 | Ht 72.0 in | Wt 214.0 lb

## 2019-09-23 DIAGNOSIS — R002 Palpitations: Secondary | ICD-10-CM | POA: Diagnosis not present

## 2019-09-23 DIAGNOSIS — R079 Chest pain, unspecified: Secondary | ICD-10-CM | POA: Diagnosis not present

## 2019-09-23 LAB — COMPREHENSIVE METABOLIC PANEL
ALT: 14 IU/L (ref 0–44)
AST: 16 IU/L (ref 0–40)
Albumin/Globulin Ratio: 2 (ref 1.2–2.2)
Albumin: 4.6 g/dL (ref 4.0–5.0)
Alkaline Phosphatase: 89 IU/L (ref 39–117)
BUN/Creatinine Ratio: 14 (ref 9–20)
BUN: 18 mg/dL (ref 6–24)
Bilirubin Total: 0.3 mg/dL (ref 0.0–1.2)
CO2: 25 mmol/L (ref 20–29)
Calcium: 9.6 mg/dL (ref 8.7–10.2)
Chloride: 103 mmol/L (ref 96–106)
Creatinine, Ser: 1.29 mg/dL — ABNORMAL HIGH (ref 0.76–1.27)
GFR calc Af Amer: 75 mL/min/{1.73_m2} (ref >59)
GFR calc non Af Amer: 65 mL/min/{1.73_m2} (ref >59)
Globulin, Total: 2.3 g/dL (ref 1.5–4.5)
Glucose: 96 mg/dL (ref 65–99)
Potassium: 5.2 mmol/L (ref 3.5–5.2)
Sodium: 140 mmol/L (ref 134–144)
Total Protein: 6.9 g/dL (ref 6.0–8.5)

## 2019-09-23 LAB — TSH: TSH: 1.84 u[IU]/mL (ref 0.450–4.500)

## 2019-09-23 LAB — CBC
Hematocrit: 44.3 % (ref 37.5–51.0)
Hemoglobin: 14.9 g/dL (ref 13.0–17.7)
MCH: 30.4 pg (ref 26.6–33.0)
MCHC: 33.6 g/dL (ref 31.5–35.7)
MCV: 90 fL (ref 79–97)
Platelets: 207 10*3/uL (ref 150–450)
RBC: 4.9 x10E6/uL (ref 4.14–5.80)
RDW: 12.9 % (ref 11.6–15.4)
WBC: 5.4 10*3/uL (ref 3.4–10.8)

## 2019-09-23 LAB — LIPID PANEL
Chol/HDL Ratio: 3.3 ratio (ref 0.0–5.0)
Cholesterol, Total: 226 mg/dL — ABNORMAL HIGH (ref 100–199)
HDL: 68 mg/dL (ref >39)
LDL Chol Calc (NIH): 145 mg/dL — ABNORMAL HIGH (ref 0–99)
Triglycerides: 73 mg/dL (ref 0–149)
VLDL Cholesterol Cal: 13 mg/dL (ref 5–40)

## 2019-09-23 NOTE — Patient Instructions (Signed)
Medication Instructions:   Your physician recommends that you continue on your current medications as directed. Please refer to the Current Medication list given to you today.  *If you need a refill on your cardiac medications before your next appointment, please call your pharmacy*    Lab Work:  TODAY-CMET, CBC, TSH, AND LIPIDS  If you have labs (blood work) drawn today and your tests are completely normal, you will receive your results only by: Marland Kitchen MyChart Message (if you have MyChart) OR . A paper copy in the mail If you have any lab test that is abnormal or we need to change your treatment, we will call you to review the results.    Follow-Up: At [facility], you and your health needs are our priority.  As part of our continuing mission to provide you with exceptional heart care, we have created designated Provider Care Teams.  These Care Teams include your primary Cardiologist (physician) and Advanced Practice Providers (APPs -  Physician Assistants and Nurse Practitioners) who all work together to provide you with the care you need, when you need it.  Your next appointment:   12 months  The format for your next appointment:   Either In Person or Virtual  Provider:   Ena Dawley, MD

## 2019-09-23 NOTE — Progress Notes (Signed)
Cardiology Office Note    Date:  09/23/2019   ID:  Vernon Charles, DOB 03/17/1971, MRN [id number]  PCP:  Marden Noble, MD  Cardiologist:  Dr. Delton See  No chief complaint on file.   History of Present Illness:  Vernon Charles is a 48 y.o. male with PMH of PTSD, shingles, history of traumatic brain injury, and palpitation.  Patient was previously seen in October 2018 by Dr. Delton See.  At that time there was scanned record that indicated low ejection fraction, however later this was discovered to be belonged to another patient and it was scanned under the wrong patient.  Unfortunately the patient did undergo cardiac catheterization on 09/13/2017 which showed normal coronaries, EF 55% by LV gram, no evidence of intracardiac shunting.  Echocardiogram obtained on 09/26/2017 showed EF 55 to 60%, grade 1 DD.  A 48-hour heart monitor obtained in November 2018 did show a few PVCs and PACs but otherwise normal sinus rhythm.  This was later repeated in March 2019 which shows sinus bradycardia to sinus tachycardia, even frequent PACs and PVCs.  He was continued on metoprolol.  Cardizem was later added to help control palpitation symptom.  Patient was recently seen in the ED on 06/26/2018 with chest discomfort, dizziness and palpitation.  Troponin was negative.  EKG does not show any significant changes.  Chest x-ray was negative as well.  He presents today for cardiology office visit.  He continued to have occasional prolonged chest pain.  He also has dizziness as well.  He has not been exercising very much for the past month.  I recommended him to continue on the current diltiazem dose and monitor blood pressure at home.  For some of his age, I would recommend blood pressure mainly in the 110 to low 130s.  If systolic blood pressure persistently goes above 140, I would recommend increasing the diltiazem to 240 mg daily.  I am not entirely sure was causing his occasional dizziness, however both low blood  pressure and high blood pressure can cause such symptoms.  He does not have a significant carotid bruit on physical exam.  Blood pressure has been occasionally running high in the 150s.  As far as the chest discomfort, he denies any alleviating or exacerbating factors such as deep inspiration, body rotational palpation.  Given the reassuring cardiac catheterization in late 2018, I would not recommend any further work-up from cardiology perspective.  I will switch his Zantac to Protonix 40 mg daily as a trial to see if this will improve his symptoms.  He did notice some improvement after starting on Zantac.  09/23/2019 -this is 1 year follow-up, the patient has been doing great, he denies any chest pain shortness of breath, he has been very active, walking 3 times a week and walked 40 miles on [address] this summer.  He has no symptoms whatsoever.   Past Medical History:  Diagnosis Date   Allergic    Chest pain    Diarrheal disease 2015   Lower back pain    Pain in the abdomen 01/2015   Palpitations    Panic attack    Plantar fasciitis, bilateral    PTSD (post-traumatic stress disorder)    Rupture of right plantaris tendon 2015   Shingles    TBI (traumatic brain injury) (HCC) 2006    Past Surgical History:  Procedure Laterality Date   ANKLE SURGERY     left 1989   NOSE SURGERY  1985   plantar facsciotomy  07/2016   RIGHT/LEFT HEART CATH AND CORONARY ANGIOGRAPHY N/A 09/13/2017   Procedure: RIGHT/LEFT HEART CATH AND CORONARY ANGIOGRAPHY;  Surgeon: Dolores PattyBensimhon, Daniel R, MD;  Location: [facility];  Service: Cardiovascular;  Laterality: N/A;   SHOULDER SURGERY     right shoulder- 2 surgery 1985 and 2011    Current Medications: Outpatient Medications Prior to Visit  Medication Sig Dispense Refill   cetirizine (ZYRTEC) 10 MG tablet Take 10 mg by mouth daily as needed for allergies.     DEXILANT 60 MG capsule Take 1 capsule by mouth daily.     diltiazem  (CARDIZEM CD) 180 MG 24 hr capsule TAKE 1 CAPSULE BY MOUTH DAILY 90 capsule 1   fluticasone (FLONASE) 50 MCG/ACT nasal spray Place 1 spray into both nostrils daily as needed for allergies or rhinitis.     esomeprazole (NEXIUM) 40 MG capsule Take 1 capsule (40 mg total) by mouth daily at 12 noon. (Patient not taking: Reported on 09/23/2019) 30 capsule 3   No facility-administered medications prior to visit.      Allergies:   Metoprolol tartrate   Social History   Socioeconomic History   Marital status: Married    Spouse name: Not on file   Number of children: Not on file   Years of education: Not on file   Highest education level: Not on file  Occupational History   Occupation: Sheriff's office    [employer]  Social Needs   Financial resource strain: Not on file   Food insecurity    Worry: Not on file    Inability: Not on file   Transportation needs    Medical: Not on file    Non-medical: Not on file  Tobacco Use   Smoking status: Former Smoker    Start date: 09/12/2001   Smokeless tobacco: Never Used  Substance and Sexual Activity   Alcohol use: Yes    Comment: socially   Drug use: No   Sexual activity: Not on file    Comment: married  Lifestyle   Physical activity    Days per week: Not on file    Minutes per session: Not on file   Stress: Not on file  Relationships   Social connections    Talks on phone: Not on file    Gets together: Not on file    Attends religious service: Not on file    Active member of club or organization: Not on file    Attends meetings of clubs or organizations: Not on file    Relationship status: Not on file  Other Topics Concern   Not on file  Social History Narrative   Not on file     Family History:  The patient's family history includes CAD in his father; COPD in his mother; CVA in his father; Heart attack in his father; Hodgkin's lymphoma in his mother; Lung disease in his mother.   ROS:     Please see the history of present illness.    ROS All other systems reviewed and are negative.   PHYSICAL EXAM:   VS:  BP (!) 126/92    Pulse 83    Ht 6' (1.829 m)    Wt 214 lb (97.1 kg)    SpO2 96%    BMI 29.02 kg/m    GEN: Well nourished, well developed, in no acute distress  HEENT: normal  Neck: no JVD, carotid bruits, or masses Cardiac: RRR; no murmurs, rubs, or gallops,no edema  Respiratory:  clear to auscultation bilaterally, normal work of breathing GI: soft, nontender, nondistended, + BS MS: no deformity or atrophy  Skin: warm and dry, no rash Neuro:  Alert and Oriented x 3, Strength and sensation are intact Psych: euthymic mood, full affect  Wt Readings from Last 3 Encounters:  09/23/19 214 lb (97.1 kg)  07/08/18 198 lb (89.8 kg)  06/28/18 196 lb (88.9 kg)      Studies/Labs Reviewed:   EKG:  EKG is not ordered today.  Recent EKG from emergency room has been reviewed, no significant ST-T wave changes, normal sinus rhythm.  Recent Labs: No results found for requested labs within last 8760 hours.   Lipid Panel No results found for: CHOL, TRIG, HDL, CHOLHDL, VLDL, LDLCALC, LDLDIRECT  Additional studies/ records that were reviewed today include:   L&RHC 09/13/2017 Findings:  RA = 3 RV = 22/7 PA = 19/7 (13) PCW = 7 Fick cardiac output/index = 7.7/3.7 PVR = 0.8 WU Ao sat = 94% PA sat = 76%, 75% High SVC sat = 75%  Assessment: 1. Normal coronary arteries 2. EF 55% by v-gram 3. Normal hemodynamics 4. No evidence of intracardiac shunting  Plan/Discussion:  His EF has normalized by v-gram. Coronaries are normal. Will repeat echo. Continue medical therapy.     Echo 09/26/2017 LV EF: 55% -   60% Study Conclusions  - Left ventricle: The cavity size was normal. Wall thickness was   normal. Systolic function was normal. The estimated ejection   fraction was in the range of 55% to 60%. Wall motion was normal;   there were no regional wall motion  abnormalities. Doppler   parameters are consistent with abnormal left ventricular   relaxation (grade 1 diastolic dysfunction). - Aortic valve: There was no stenosis. - Mitral valve: There was no significant regurgitation. - Right ventricle: The cavity size was normal. Systolic function   was normal. - Pulmonary arteries: No complete TR doppler jet so unable to   estimate PA systolic pressure. - Inferior vena cava: The vessel was normal in size. The   respirophasic diameter changes were in the normal range (>= 50%),   consistent with normal central venous pressure.  Impressions:  - Nomral LV size with EF 60-65%. Normal RV size and systolic   function. No significant valvular abnormalities.   Holter 02/14/2018 Study Highlights     Sinus bradycardia to sinus tachycardia.  Infrequent PACs and PVCs.  No runs.   Sinus bradycardia to sinus tachycardia. Infrequent PACs and PVCs. No runs. I would continue the same dose of metoprolol.   EKG performed today 09/23/2019 shows normal sinus rhythm normal EKG unchanged from prior.  ASSESSMENT:    1. Palpitations   2. Chest pain, unspecified type      PLAN:  In order of problems listed above:  1. Chest pain: Unlikely to be cardiac in nature given reassuring study in October 2018.  There is no exertional component to his chest pain.  This is resolved with taking Nexium.  He will continue, he is encouraged to continue exercising. w in this young patient. 2. Palpitation: Currently on diltiazem 180 mg daily.  He is asymptomatic.  Medication Adjustments/Labs and Tests Ordered: Current medicines are reviewed at length with the patient today.  Concerns regarding medicines are outlined above.  Medication changes, Labs and Tests ordered today are listed in the Patient Instructions below. Patient Instructions  Medication Instructions:   Your physician recommends that you continue on your current medications as directed. Please  refer to  the Current Medication list given to you today.  *If you need a refill on your cardiac medications before your next appointment, please call your pharmacy*    Lab Work:  TODAY-CMET, CBC, TSH, AND LIPIDS  If you have labs (blood work) drawn today and your tests are completely normal, you will receive your results only by:  [place] (if you have MyChart) OR  A paper copy in the mail If you have any lab test that is abnormal or we need to change your treatment, we will call you to review the results.    Follow-Up: At [facility], you and your health needs are our priority.  As part of our continuing mission to provide you with exceptional heart care, we have created designated Provider Care Teams.  These Care Teams include your primary Cardiologist (physician) and Advanced Practice Providers (APPs -  Physician Assistants and Nurse Practitioners) who all work together to provide you with the care you need, when you need it.  Your next appointment:   12 months  The format for your next appointment:   Either In Person or Virtual  Provider:   Ena Dawley, MD       Signed, Ena Dawley, MD  09/23/2019 8:58 AM    [address], [place], [place]  [address] Phone: [phone]; Fax: [phone]

## 2019-09-24 ENCOUNTER — Telehealth: Payer: Self-pay | Admitting: *Deleted

## 2019-09-24 MED ORDER — ROSUVASTATIN CALCIUM 5 MG PO TABS: 5.0000 mg | ORAL_TABLET | Freq: Every day | ORAL | 3 refills | Status: DC

## 2019-09-24 NOTE — Telephone Encounter (Signed)
Pt has been notified of lab results and recommendations by phone with verbal understanding. Pt is agreeable to start Crestor 5 mg daily. New Rx has been sent in to [facility] in [place]. Pt would like Rx to be sent in for 90 dys x 3. Pt thanked me for the call. Patient notified of result.  Please refer to phone note from today for complete details.   Julaine Hua, [facility] 09/24/2019 10:54 AM

## 2019-09-24 NOTE — Telephone Encounter (Signed)
-----   Message from Nuala Alpha, LPN sent at 72/03/3663  7:20 AM EST -----  ----- Message ----- From: Dorothy Spark, MD Sent: 09/23/2019   7:04 PM EST To: Nuala Alpha, LPN  All labs normal except for elevated LDL, I would start rosuvastatin 5 mg po daily if he is willing to take it

## 2020-03-17 ENCOUNTER — Other Ambulatory Visit: Payer: Self-pay | Admitting: Cardiology

## 2020-10-13 ENCOUNTER — Other Ambulatory Visit: Payer: Self-pay | Admitting: Cardiology

## 2020-10-28 ENCOUNTER — Emergency Department (HOSPITAL_BASED_OUTPATIENT_CLINIC_OR_DEPARTMENT_OTHER)
Admission: EM | Admit: 2020-10-28 | Discharge: 2020-10-28 | Disposition: A | Discharge status: Home / Self Care | Payer: 59 | Attending: Emergency Medicine | Admitting: Emergency Medicine

## 2020-10-28 ENCOUNTER — Encounter (HOSPITAL_BASED_OUTPATIENT_CLINIC_OR_DEPARTMENT_OTHER): Payer: Self-pay | Admitting: Emergency Medicine

## 2020-10-28 ENCOUNTER — Other Ambulatory Visit: Payer: Self-pay

## 2020-10-28 ENCOUNTER — Emergency Department (HOSPITAL_BASED_OUTPATIENT_CLINIC_OR_DEPARTMENT_OTHER): Payer: 59

## 2020-10-28 DIAGNOSIS — Z87891 Personal history of nicotine dependence: Secondary | ICD-10-CM | POA: Diagnosis not present

## 2020-10-28 DIAGNOSIS — S63622A Sprain of interphalangeal joint of left thumb, initial encounter: Secondary | ICD-10-CM | POA: Diagnosis not present

## 2020-10-28 DIAGNOSIS — Z79899 Other long term (current) drug therapy: Secondary | ICD-10-CM | POA: Insufficient documentation

## 2020-10-28 DIAGNOSIS — M79645 Pain in left finger(s): Secondary | ICD-10-CM | POA: Diagnosis present

## 2020-10-28 DIAGNOSIS — X500XXA Overexertion from strenuous movement or load, initial encounter: Secondary | ICD-10-CM | POA: Diagnosis not present

## 2020-10-28 MED ORDER — ACETAMINOPHEN 325 MG PO TABS
650.0000 mg | ORAL_TABLET | Freq: Once | ORAL | Status: AC
  Administered 2020-10-28: 650 mg via ORAL
  Filled 2020-10-28: qty 2

## 2020-10-28 NOTE — ED Triage Notes (Signed)
Reports picking up a bucket last night.  Noticed this morning pain and swelling in the joint of the left thumb.

## 2020-10-28 NOTE — Discharge Instructions (Addendum)
Call your primary care doctor or specialist as discussed in the next 2-3 days.   Return immediately back to the ER if:  Your symptoms worsen within the next 12-24 hours. You develop new symptoms such as new fevers, persistent vomiting, new pain, shortness of breath, or new weakness or numbness, or if you have any other concerns.  

## 2020-10-28 NOTE — ED Provider Notes (Addendum)
[facility] EMERGENCY DEPARTMENT Provider Note   CSN: [id number] Arrival date & time: 10/28/20  1043     History Chief Complaint  Patient presents with  . Joint Swelling    Left thumb pain and swelling    [facility] Vernon Charles is a 49 y.o. male.  Patient presents with chief complaint of left thumb pain.  Symptoms started last night.  He was moving a bucket with his left hand while putting up Christmas lights.  Soon afterwards as he came in and his hand started warm above he noticed some pain and discomfort to his left hand and thumb.  Woke up this morning and the pain appeared worse and some increased swelling of the left thumb region.  Denies other falls or trauma.  Denies any injury or laceration to his knowledge.  No fever no cough no vomiting no diarrhea.        Past Medical History:  Diagnosis Date  . Allergic   . Chest pain   . Diarrheal disease 2015  . Lower back pain   . Pain in the abdomen 01/2015  . Palpitations   . Panic attack   . Plantar fasciitis, bilateral   . PTSD (post-traumatic stress disorder)   . Rupture of right plantaris tendon 2015  . Shingles   . TBI (traumatic brain injury) [facility]) 2006    Patient Active Problem List   Diagnosis Date Noted  . Palpitations 02/06/2018  . Plantar fasciitis of right foot 04/14/2015    Past Surgical History:  Procedure Laterality Date  . ANKLE SURGERY     left 1989  . NOSE SURGERY  1985  . plantar facsciotomy  07/2016  . RIGHT/LEFT HEART CATH AND CORONARY ANGIOGRAPHY N/A 09/13/2017   Procedure: RIGHT/LEFT HEART CATH AND CORONARY ANGIOGRAPHY;  Surgeon: Dolores Patty, MD;  Location: [facility] INVASIVE CV LAB;  Service: Cardiovascular;  Laterality: N/A;  . SHOULDER SURGERY     right shoulder- 2 surgery 1985 and 2011       Family History  Problem Relation Age of Onset  . Lung disease Mother   . Hodgkin's lymphoma Mother   . COPD Mother   . CVA Father   . Heart attack Father   . CAD Father      Social History   Tobacco Use  . Smoking status: Former Smoker    Start date: 09/12/2001  . Smokeless tobacco: Never Used  Substance Use Topics  . Alcohol use: Yes    Comment: socially  . Drug use: No    Home Medications Prior to Admission medications   Medication Sig Start Date End Date Taking? Authorizing Provider  cetirizine (ZYRTEC) 10 MG tablet Take 10 mg by mouth daily as needed for allergies.    [provider]  DEXILANT 60 MG capsule Take 1 capsule by mouth daily. 08/24/19   [provider]  diltiazem (CARDIZEM CD) 180 MG 24 hr capsule TAKE 1 CAPSULE BY MOUTH DAILY 10/14/20   Lars Masson, MD  fluticasone [facility]) 50 MCG/ACT nasal spray Place 1 spray into both nostrils daily as needed for allergies or rhinitis.    [provider]  rosuvastatin (CRESTOR) 5 MG tablet TAKE 1 TABLET(5 MG) BY MOUTH DAILY 10/14/20   Lars Masson, MD    Allergies    Metoprolol tartrate  Review of Systems   Review of Systems  Constitutional: Negative for fever.  HENT: Negative for ear pain and sore throat.   Eyes: Negative for pain.  Respiratory: Negative for cough.   Cardiovascular: Negative for chest pain.  Gastrointestinal: Negative for abdominal pain.  Genitourinary: Negative for flank pain.  Musculoskeletal: Negative for back pain.  Skin: Negative for color change and rash.  Neurological: Negative for syncope.  All other systems reviewed and are negative.   Physical Exam Updated Vital Signs BP (!) 124/94 (BP Location: Left Arm)   Pulse 70   Temp 98.2 F (36.8 C) (Oral)   Resp 16   Ht 6' (1.829 m)   Wt 93.4 kg   SpO2 100%   BMI 27.94 kg/m   Physical Exam Constitutional:      General: He is not in acute distress.    Appearance: He is well-developed.  HENT:     Head: Normocephalic.     Nose: Nose normal.  Eyes:     Extraocular Movements: Extraocular movements intact.  Cardiovascular:     Rate and Rhythm: Normal rate.   Pulmonary:     Effort: Pulmonary effort is normal.  Musculoskeletal:     Comments: Tenderness swelling on palpation of the interphalangeal joint of the left thumb.  No MCP joint laxity or tenderness noted.  No no bed hematoma noted.  Flexion extension of the interphalangeal joint otherwise intact.  Sensation intact to light touch.  Skin:    Coloration: Skin is not jaundiced.  Neurological:     Mental Status: He is alert. Mental status is at baseline.     ED Results / Procedures / Treatments   Labs (all labs ordered are listed, but only abnormal results are displayed) Labs Reviewed - No data to display  EKG None  Radiology DG Finger Thumb Left  Result Date: 10/28/2020 CLINICAL DATA:  Injury.  Pain and swelling EXAM: LEFT THUMB 2+V COMPARISON:  None. FINDINGS: There is no evidence of fracture or dislocation. There is no evidence of arthropathy or other focal bone abnormality. Soft tissues are unremarkable. IMPRESSION: Negative. Electronically Signed   By: Marlan Palau M.D.   On: 10/28/2020 11:34    Procedures .Ortho Injury Treatment  Date/Time: 12/19/2020 9:06 PM Performed by: Cheryll Cockayne, MD Authorized by: Cheryll Cockayne, MD  Comments: Left thumb finger splint placed.  Neurovascular intact after placement.    (including critical care time)  Medications Ordered in ED Medications  acetaminophen (TYLENOL) tablet 650 mg (650 mg Oral Given 10/28/20 1116)    ED Course  I have reviewed the triage vital signs and the nursing notes.  Pertinent labs & imaging results that were available during my care of the patient were reviewed by me and considered in my medical decision making (see chart for details).    MDM Rules/Calculators/A&P                          Meeting unremarkable for any acute findings. Patient has some instability of the left thumb PIP joint when ulnar pressure is applied.  I will recommend outpatient follow-up with hand specialist within the week.   Advised immediate return for fevers worsening pain or any additional concerns.  Left hand placed in a finger splint, Neurovasc intact after placement.  Final Clinical Impression(s) / ED Diagnoses Final diagnoses:  Sprain of interphalangeal joint of left thumb, initial encounter    Rx / DC Orders ED Discharge Orders    None       Cheryll Cockayne, MD 10/28/20 1209    Cheryll Cockayne, MD 12/19/20 2107

## 2020-11-17 ENCOUNTER — Other Ambulatory Visit: Payer: Self-pay | Admitting: Cardiology

## 2020-11-17 ENCOUNTER — Other Ambulatory Visit: Payer: Self-pay

## 2020-11-17 MED ORDER — ROSUVASTATIN CALCIUM 5 MG PO TABS: 5.0000 mg | ORAL_TABLET | Freq: Every day | ORAL | 0 refills | Status: DC

## 2020-11-17 MED ORDER — DILTIAZEM HCL ER COATED BEADS 180 MG PO CP24: 180.0000 mg | ORAL_CAPSULE | Freq: Every day | ORAL | 0 refills | Status: DC

## 2020-11-21 NOTE — Telephone Encounter (Signed)
Yes please refill 

## 2020-11-25 ENCOUNTER — Other Ambulatory Visit: Payer: Self-pay | Admitting: Cardiology

## 2020-12-19 ENCOUNTER — Telehealth: Payer: Self-pay | Admitting: Cardiology

## 2020-12-19 MED ORDER — ROSUVASTATIN CALCIUM 5 MG PO TABS: 5.0000 mg | ORAL_TABLET | Freq: Every day | ORAL | 1 refills | Status: DC

## 2020-12-19 MED ORDER — DILTIAZEM HCL ER COATED BEADS 180 MG PO CP24: 180.0000 mg | ORAL_CAPSULE | Freq: Every day | ORAL | 1 refills | Status: DC

## 2020-12-19 NOTE — Telephone Encounter (Signed)
Pt c/o medication issue: 1. Name of Medication: diltiazem 180mg , rosuvastatin 5mg   2. How are you currently taking this medication (dosage and times per day)? As written  3. Are you having a reaction (difficulty breathing--STAT)? No   4. What is your medication issue? Patient needs refills

## 2020-12-19 NOTE — Telephone Encounter (Signed)
Pt's medication was sent to pt's pharmacy as requested. Confirmation received.  °

## 2021-03-27 ENCOUNTER — Ambulatory Visit: Payer: 59 | Admitting: Cardiology

## 2021-04-13 NOTE — Progress Notes (Signed)
Cardiology Office Note:    Date:  04/14/2021   ID:  PEDRO WHITERS, DOB 28-Jan-1971, MRN [id number]  PCP:  Marden Noble, MD   [facility] Providers Cardiologist:  None {  Referring MD: Marden Noble, MD     History of Present Illness:    Vernon Charles is a 50 y.o. male with a hx of PTSD, shingles, traumatic brain injury, and palpitations who was previously followed by Dr. Delton See who now returns to clinic for follow-up of palpitations.  Per review of the record, there was a scanned record in the patient's chart that indicated he had a low EF on echo. This report actually belonged to another patient. Unfortunately, the patient did undergo cardiac cath on 09/13/17 which showed normal coronaires and EF 55% by LV gram. TTE 09/26/2017 showed EF 55 to 60%, grade 1 DD.  A 48-hour heart monitor obtained in November 2018 did show a few PVCs and PACs but otherwise normal sinus rhythm.  This was later repeated in March 2019 which shows sinus bradycardia to sinus tachycardia, even frequent PACs and PVCs. He was continued on metoprolol.  Cardizem was later added to help control palpitation symptom.  Patient was seen in the ED on 06/26/2018 with chest discomfort, dizziness and palpitation.  Troponin was negative.  EKG did not show any significant changes.  Chest x-ray was negative as well.  Last saw Dr. Delton See in 09/2019 where he was doing well and very active without symptoms.  Today, the patient feels well and remains very active. He runs 3x/week and walks every night. No exertional chest pain, SOB, orthopnea, PND, palpitations, LE edema. Doing very well on cardizem with no recurrence of palpitations.   Past Medical History:  Diagnosis Date  . Allergic   . Chest pain   . Diarrheal disease 2015  . Lower back pain   . Pain in the abdomen 01/2015  . Palpitations   . Panic attack   . Plantar fasciitis, bilateral   . PTSD (post-traumatic stress disorder)   . Rupture of right plantaris  tendon 2015  . Shingles   . TBI (traumatic brain injury) ([facility]) 2006    Past Surgical History:  Procedure Laterality Date  . ANKLE SURGERY     left 1989  . NOSE SURGERY  1985  . plantar facsciotomy  07/2016  . RIGHT/LEFT HEART CATH AND CORONARY ANGIOGRAPHY N/A 09/13/2017   Procedure: RIGHT/LEFT HEART CATH AND CORONARY ANGIOGRAPHY;  Surgeon: Dolores Patty, MD;  Location: [facility] INVASIVE CV LAB;  Service: Cardiovascular;  Laterality: N/A;  . SHOULDER SURGERY     right shoulder- 2 surgery 1985 and 2011    Current Medications: Current Meds  Medication Sig  . cetirizine (ZYRTEC) 10 MG tablet Take 10 mg by mouth daily as needed for allergies.  Marland Kitchen diltiazem (CARDIZEM CD) 180 MG 24 hr capsule Take 1 capsule (180 mg total) by mouth daily. Please keep upcoming appt in May 2022 with Dr. Delton See before anymore refills. Thank you Final Attempt  . fluticasone (FLONASE) 50 MCG/ACT nasal spray Place 1 spray into both nostrils daily as needed for allergies or rhinitis.  . rosuvastatin (CRESTOR) 5 MG tablet Take 1 tablet (5 mg total) by mouth daily. Please keep upcoming appt in May 2022 with Dr. Delton See before anymore refills. Thank you Final Attempt     Allergies:   Metoprolol tartrate   Social History   Socioeconomic History  . Marital status: Married    Spouse name: Not on file  .  Number of children: Not on file  . Years of education: Not on file  . Highest education level: Not on file  Occupational History  . Occupation: Sheriff's office    Employer: [employer]  Tobacco Use  . Smoking status: Former Smoker    Start date: 09/12/2001  . Smokeless tobacco: Never Used  Substance and Sexual Activity  . Alcohol use: Yes    Comment: socially  . Drug use: No  . Sexual activity: Not on file    Comment: married  Other Topics Concern  . Not on file  Social History Narrative  . Not on file   Social Determinants of Health   Financial Resource Strain: Not on file  Food Insecurity: Not  on file  Transportation Needs: Not on file  Physical Activity: Not on file  Stress: Not on file  Social Connections: Not on file     Family History: The patient's family history includes CAD in his father; COPD in his mother; CVA in his father; Heart attack in his father; Hodgkin's lymphoma in his mother; Lung disease in his mother.  ROS:   Please see the history of present illness.    Review of Systems  Constitutional: Negative for chills and fever.  HENT: Negative for hearing loss.   Eyes: Negative for blurred vision.  Respiratory: Negative for shortness of breath.   Cardiovascular: Negative for chest pain, palpitations, orthopnea, claudication, leg swelling and PND.  Gastrointestinal: Negative for melena, nausea and vomiting.  Genitourinary: Negative for dysuria.  Musculoskeletal: Negative for falls.  Neurological: Negative for dizziness and loss of consciousness.  Endo/Heme/Allergies: Negative for polydipsia.  Psychiatric/Behavioral: Negative for substance abuse.    EKGs/Labs/Other Studies Reviewed:    The following studies were reviewed today: [facility] 09/13/2017 Findings:  RA = 3 RV = 22/7 PA = 19/7 (13) PCW = 7 Fick cardiac output/index = 7.7/3.7 PVR = 0.8 WU Ao sat = 94% PA sat = 76%, 75% High SVC sat = 75%  Assessment: 1. Normal coronary arteries 2. EF 55% by v-gram 3. Normal hemodynamics 4. No evidence of intracardiac shunting  Plan/Discussion:  His EF has normalized by v-gram. Coronaries are normal. Will repeat echo. Continue medical therapy.     Echo 09/26/2017 LV EF: 55% - 60% Study Conclusions  - Left ventricle: The cavity size was normal. Wall thickness was normal. Systolic function was normal. The estimated ejection fraction was in the range of 55% to 60%. Wall motion was normal; there were no regional wall motion abnormalities. Doppler parameters are consistent with abnormal left ventricular relaxation (grade 1 diastolic  dysfunction). - Aortic valve: There was no stenosis. - Mitral valve: There was no significant regurgitation. - Right ventricle: The cavity size was normal. Systolic function was normal. - Pulmonary arteries: No complete TR doppler jet so unable to estimate PA systolic pressure. - Inferior vena cava: The vessel was normal in size. The respirophasic diameter changes were in the normal range (>= 50%), consistent with normal central venous pressure.  Impressions:  - Nomral LV size with EF 60-65%. Normal RV size and systolic function. No significant valvular abnormalities.   Holter 02/14/2018 Study Highlights     Sinus bradycardia to sinus tachycardia.  Infrequent PACs and PVCs.  No runs.  Sinus bradycardia to sinus tachycardia. Infrequent PACs and PVCs. No runs. I would continue the same dose of metoprolol.     EKG:  EKG is  ordered today.  The ekg ordered today demonstrates NSR with HR 68  Recent  Labs: No results found for requested labs within last 8760 hours.  Recent Lipid Panel    Component Value Date/Time   CHOL 226 (H) 09/23/2019 0906   TRIG 73 09/23/2019 0906   HDL 68 09/23/2019 0906   CHOLHDL 3.3 09/23/2019 0906   LDLCALC 145 (H) 09/23/2019 0906     Physical Exam:    VS:  BP 124/80   Pulse 68   Ht 6' (1.829 m)   Wt 205 lb (93 kg)   SpO2 98%   BMI 27.80 kg/m     Wt Readings from Last 3 Encounters:  04/14/21 205 lb (93 kg)  10/28/20 206 lb (93.4 kg)  09/23/19 214 lb (97.1 kg)     GEN:  Well nourished, well developed in no acute distress HEENT: Normal NECK: No JVD; No carotid bruits CARDIAC: RRR, no murmurs, rubs, gallops RESPIRATORY:  Clear to auscultation without rales, wheezing or rhonchi  ABDOMEN: Soft, non-tender, non-distended MUSCULOSKELETAL:  No edema; No deformity  SKIN: Warm and dry NEUROLOGIC:  Alert and oriented x 3 PSYCHIATRIC:  Normal affect   ASSESSMENT:    1. Palpitations   2. Chest pain, unspecified type    3. Pure hypercholesterolemia    PLAN:    In order of problems listed above:  #Noncardiac Chest Pain: Resolved. Cath in 2018 without significant CAD. TTE with normal BiV function and no significant valve disease. Remains very active with no exertional symptoms. -Continue healthy diet and exercise  #Palpitations: Significantly improved. Monitor in 2019 with no sustained arrhythmias or significant ectopy. -Continue dilt 180mg  daily  #HLD: LDL 84 02/2020. No CAD. Goal LDL <100. -Continue crestor 5mg  daily -Repeat lipids per PCP  Medication Adjustments/Labs and Tests Ordered: Current medicines are reviewed at length with the patient today.  Concerns regarding medicines are outlined above.  Orders Placed This Encounter  Procedures  . EKG 12-Lead   No orders of the defined types were placed in this encounter.   Patient Instructions  Medication Instructions:  Your physician recommends that you continue on your current medications as directed. Please refer to the Current Medication list given to you today.  *If you need a refill on your cardiac medications before your next appointment, please call your pharmacy*   Lab Work: None If you have labs (blood work) drawn today and your tests are completely normal, you will receive your results only by: 03/2020 MyChart Message (if you have MyChart) OR . A paper copy in the mail If you have any lab test that is abnormal or we need to change your treatment, we will call you to review the results.   Testing/Procedures: None   Follow-Up: At [facility], you and your health needs are our priority.  As part of our continuing mission to provide you with exceptional heart care, we have created designated Provider Care Teams.  These Care Teams include your primary Cardiologist (physician) and Advanced Practice Providers (APPs -  Physician Assistants and Nurse Practitioners) who all work together to provide you with the care you need, when you  need it.  We recommend signing up for the patient portal called "MyChart".  Sign up information is provided on this After Visit Summary.  MyChart is used to connect with patients for Virtual Visits (Telemedicine).  Patients are able to view lab/test results, encounter notes, upcoming appointments, etc.  Non-urgent messages can be sent to your provider as well.   To learn more about what you can do with MyChart, go to Marland Kitchen.  Your next appointment:   1 year(s)  The format for your next appointment:   In Person  Provider:   You may see Dr. Shari ProwsPemberton or one of the following Advanced Practice Providers on your designated Care Team:    Tereso NewcomerScott Weaver, PA-C  Chelsea AusVin Bhagat, [place] JerseyPA-C    Other Instructions      Signed, Meriam SpragueHeather E Dia Donate, MD  04/14/2021 11:00 AM    [facility]

## 2021-04-14 ENCOUNTER — Ambulatory Visit: Payer: 59 | Admitting: Cardiology

## 2021-04-14 ENCOUNTER — Other Ambulatory Visit: Payer: Self-pay

## 2021-04-14 ENCOUNTER — Encounter: Payer: Self-pay | Admitting: Cardiology

## 2021-04-14 VITALS — BP 124/80 | HR 68 | Ht 72.0 in | Wt 205.0 lb

## 2021-04-14 DIAGNOSIS — R079 Chest pain, unspecified: Secondary | ICD-10-CM | POA: Diagnosis not present

## 2021-04-14 DIAGNOSIS — R002 Palpitations: Secondary | ICD-10-CM

## 2021-04-14 DIAGNOSIS — E7800 Pure hypercholesterolemia, unspecified: Secondary | ICD-10-CM | POA: Diagnosis not present

## 2021-04-14 NOTE — Patient Instructions (Signed)
Medication Instructions:  Your physician recommends that you continue on your current medications as directed. Please refer to the Current Medication list given to you today.  *If you need a refill on your cardiac medications before your next appointment, please call your pharmacy*   Lab Work: None If you have labs (blood work) drawn today and your tests are completely normal, you will receive your results only by: . MyChart Message (if you have MyChart) OR . A paper copy in the mail If you have any lab test that is abnormal or we need to change your treatment, we will call you to review the results.   Testing/Procedures: None   Follow-Up: At [facility], you and your health needs are our priority.  As part of our continuing mission to provide you with exceptional heart care, we have created designated Provider Care Teams.  These Care Teams include your primary Cardiologist (physician) and Advanced Practice Providers (APPs -  Physician Assistants and Nurse Practitioners) who all work together to provide you with the care you need, when you need it.  We recommend signing up for the patient portal called "MyChart".  Sign up information is provided on this After Visit Summary.  MyChart is used to connect with patients for Virtual Visits (Telemedicine).  Patients are able to view lab/test results, encounter notes, upcoming appointments, etc.  Non-urgent messages can be sent to your provider as well.   To learn more about what you can do with MyChart, go to [email or web address].    Your next appointment:   1 year(s)  The format for your next appointment:   In Person  Provider:   You may see Dr. Pemberton or one of the following Advanced Practice Providers on your designated Care Team:    Scott Weaver, PA-C  Vin Bhagat, PA-C    Other Instructions   

## 2021-06-12 ENCOUNTER — Other Ambulatory Visit: Payer: Self-pay | Admitting: *Deleted

## 2021-06-12 MED ORDER — ROSUVASTATIN CALCIUM 5 MG PO TABS
5.0000 mg | ORAL_TABLET | Freq: Every day | ORAL | 3 refills | Status: AC
Start: 2021-06-12 — End: ?

## 2021-06-12 MED ORDER — DILTIAZEM HCL ER COATED BEADS 180 MG PO CP24: 180.0000 mg | ORAL_CAPSULE | Freq: Every day | ORAL | 3 refills | Status: AC

## 2022-01-30 ENCOUNTER — Encounter: Payer: Self-pay | Admitting: Cardiology

## 2024-05-24 ENCOUNTER — Other Ambulatory Visit: Payer: Self-pay

## 2024-05-24 ENCOUNTER — Encounter (HOSPITAL_BASED_OUTPATIENT_CLINIC_OR_DEPARTMENT_OTHER): Payer: Self-pay

## 2024-05-24 ENCOUNTER — Emergency Department (HOSPITAL_BASED_OUTPATIENT_CLINIC_OR_DEPARTMENT_OTHER)
Admission: EM | Admit: 2024-05-24 | Discharge: 2024-05-24 | Disposition: A | Discharge status: Home / Self Care | Attending: Emergency Medicine | Admitting: Emergency Medicine

## 2024-05-24 DIAGNOSIS — K644 Residual hemorrhoidal skin tags: Secondary | ICD-10-CM | POA: Insufficient documentation

## 2024-05-24 DIAGNOSIS — K625 Hemorrhage of anus and rectum: Secondary | ICD-10-CM | POA: Insufficient documentation

## 2024-05-24 LAB — CBC
HCT: 43.2 % (ref 39.0–52.0)
Hemoglobin: 14.6 g/dL (ref 13.0–17.0)
MCH: 30.2 pg (ref 26.0–34.0)
MCHC: 33.8 g/dL (ref 30.0–36.0)
MCV: 89.3 fL (ref 80.0–100.0)
Platelets: 187 K/uL (ref 150–400)
RBC: 4.84 MIL/uL (ref 4.22–5.81)
RDW: 12 % (ref 11.5–15.5)
WBC: 6.7 K/uL (ref 4.0–10.5)
nRBC: 0 % (ref 0.0–0.2)

## 2024-05-24 LAB — COMPREHENSIVE METABOLIC PANEL WITH GFR
ALT: 29 U/L (ref 0–44)
AST: 23 U/L (ref 15–41)
Albumin: 4.3 g/dL (ref 3.5–5.0)
Alkaline Phosphatase: 111 U/L (ref 38–126)
Anion gap: 11 (ref 5–15)
BUN: 15 mg/dL (ref 6–20)
CO2: 25 mmol/L (ref 22–32)
Calcium: 9.6 mg/dL (ref 8.9–10.3)
Chloride: 103 mmol/L (ref 98–111)
Creatinine, Ser: 1.31 mg/dL — ABNORMAL HIGH (ref 0.61–1.24)
GFR, Estimated: >60 mL/min (ref >60)
Glucose, Bld: 84 mg/dL (ref 70–99)
Potassium: 4 mmol/L (ref 3.5–5.1)
Sodium: 140 mmol/L (ref 135–145)
Total Bilirubin: 0.4 mg/dL (ref 0.0–1.2)
Total Protein: 7.3 g/dL (ref 6.5–8.1)

## 2024-05-24 NOTE — ED Provider Notes (Signed)
 [place] EMERGENCY DEPARTMENT AT [facility] Provider Note   CSN: [id number] Arrival date & time: 05/24/24  1655     Patient presents with: Rectal Bleeding   Vernon Charles is a 53 y.o. male with history of Barrett's esophagus, who presents with concern for 2 episodes of bright red blood per rectum today.  States these occurred with his bowel movements.  Denies any black or tarry stools.  He denies any bleeding from his rectum continuously.  Reports he has been struggling with heartburn the past couple of days, but otherwise has been feeling at baseline.  Denies any shortness of breath, weakness, dizziness.  Denies any constipation.  He is not on any blood thinners, does not take any NSAID medications.    Rectal Bleeding      Prior to Admission medications   Medication Sig Start Date End Date Taking? Authorizing Provider  cetirizine (ZYRTEC) 10 MG tablet Take 10 mg by mouth daily as needed for allergies.    [provider]  diltiazem  (CARDIZEM  CD) 180 MG 24 hr capsule Take 1 capsule (180 mg total) by mouth daily. 06/12/21 06/12/22  Pemberton, Heather E, MD  fluticasone (FLONASE) 50 MCG/ACT nasal spray Place 1 spray into both nostrils daily as needed for allergies or rhinitis.    [provider]  rosuvastatin  (CRESTOR ) 5 MG tablet Take 1 tablet (5 mg total) by mouth daily. 06/12/21   Hobart Powell BRAVO, MD    Allergies: Metoprolol  tartrate    Review of Systems  Gastrointestinal:  Positive for hematochezia.    Updated Vital Signs BP 138/88 (BP Location: Right Arm)   Pulse 99   Temp 98.6 F (37 C) (Oral)   Resp 17   Ht 6' (1.829 m)   Wt 95.3 kg   SpO2 99%   BMI 28.48 kg/m   Physical Exam Vitals and nursing note reviewed. Exam conducted with a chaperone present.  Constitutional:      Appearance: Normal appearance.  HENT:     Head: Atraumatic.  Cardiovascular:     Rate and Rhythm: Normal rate and regular rhythm.  Pulmonary:     Effort:  Pulmonary effort is normal.  Abdominal:     Palpations: Abdomen is soft.     Tenderness: There is no abdominal tenderness. There is no guarding.  Genitourinary:    Comments: RN Jon present for rectal exam  Patient with small nonthrombosed external hemorrhoid.  Stool brown appearing.  There is a mild amount of bright red blood in stool sample obtained, but no active bleeding. Neurological:     General: No focal deficit present.     Mental Status: He is alert.  Psychiatric:        Mood and Affect: Mood normal.        Behavior: Behavior normal.     (all labs ordered are listed, but only abnormal results are displayed) Labs Reviewed  COMPREHENSIVE METABOLIC PANEL WITH GFR - Abnormal; Notable for the following components:      Result Value   Creatinine, Ser 1.31 (*)    All other components within normal limits  CBC  POC OCCULT BLOOD, ED    EKG: None  Radiology: No results found.   Procedures   Medications Ordered in the ED - No data to display                                  Medical Decision  Making Amount and/or Complexity of Data Reviewed Labs: ordered.     Differential diagnosis includes but is not limited to upper GI bleed, diverticular bleed, hemorrhoid, acute blood loss anemia  ED Course:  Upon initial evaluation, patient is well-appearing, no acute distress.  Stable vitals.  Reports 2 episodes of bright red blood per rectum with his bowel movements today.  On exam with RN chaperone present, patient with small nonthrombosed hemorrhoid at the rectal opening.  No anal fissures noted.  Stool brown appearing with small amount of bright red blood noted in stool sample.  No melanotic appearing stool.  No active rectal bleeding.  CBC with stable hemoglobin at 14.6.  He denies any further episodes of rectal bleeding while being in the emergency room. No concern for acute blood loss/ongoing blood loss that would require monitoring or blood transfusion. Given hemorrhoid  on exam and bright red blood noted, suspect this is the source of his bleeding.  Lower concern for diverticular bleed at this time. Lower concern for upper GI bleed given no melanotic stools.  Do not feel he needs any emergent CT imaging at this time.  Discussed return precautions with patient.  Recommended he follow-up with his GI doctor within the next 2 weeks, he states he has a GI doctor he follows with for his Barrett's esophagus and will call to schedule an appointment.  Stable and appropriate for discharge home  Labs Ordered: I Ordered, and personally interpreted labs.  The pertinent results include:   CBC within normal limits CMP with elevated creatinine 1.31, appears to be at baseline.  No electrolyte abnormalities, no elevation in LFTs   Impression: Bright red blood per rectum Nonthrombosed external hemorrhoid  Disposition:  The patient was discharged home with instructions to follow-up with his GI doctor within the next 2 weeks.  Take daily MiraLAX for the next couple of days to maintain soft bowel movements.  Avoid straining when using the restroom. Return precautions given.   This chart was dictated using voice recognition software, Dragon. Despite the best efforts of this provider to proofread and correct errors, errors may still occur which can change documentation meaning.       Final diagnoses:  Bright red blood per rectum  External hemorrhoid    ED Discharge Orders     None          Veta Palma, PA-C 05/24/24 1832    Geraldene Hamilton, MD 05/24/24 2227

## 2024-05-24 NOTE — ED Triage Notes (Signed)
 Pt reports rectal bleeding starting this AM. Pt reports hx of GERD. Pt reports bright red blood in toilet.

## 2024-05-24 NOTE — Discharge Instructions (Addendum)
 The rectal bleeding you are experiencing is most likely from a hemorrhoid.  Take 1 scoop(s) of Miralax in the morning for the next couple of days to help soften your bowel movements. You can get over the counter at any drugstore.   Avoid straining when using the restroom as this can worsen hemorrhoids.  If you are continuing to have rectal bleeding, please make an appointment with your GI provider within the next 2 weeks.  Your blood counts are stable today.  You have not lost too much blood.  Your kidney function (creatinine) was elevated, but your baseline. This means that your kidneys are not filtering as well as they should be. Please continue to have this monitored by your PCP.  Your liver tests are normal today.  Return to the ER for any black stools, profuse/persistent rectal bleeding, feeling weak or dizzy, worsening abdominal pain, any other new or concerning symptoms

## 2024-05-27 ENCOUNTER — Ambulatory Visit
Admission: RE | Admit: 2024-05-27 | Discharge: 2024-05-27 | Disposition: A | Discharge status: Home / Self Care | Source: Ambulatory Visit | Attending: Student

## 2024-05-27 ENCOUNTER — Other Ambulatory Visit: Payer: Self-pay | Admitting: Student

## 2024-05-27 DIAGNOSIS — R109 Unspecified abdominal pain: Secondary | ICD-10-CM
